# Patient Record
Sex: Male | Born: 1970 | Race: White | Hispanic: No | Marital: Married | Smoking: Never smoker
Health system: Southern US, Community
[De-identification: ages and names within clinical notes are randomized; demographics above are authoritative.]

## PROBLEM LIST (undated history)

## (undated) DIAGNOSIS — T7840XA Allergy, unspecified, initial encounter: Secondary | ICD-10-CM

## (undated) DIAGNOSIS — J45909 Unspecified asthma, uncomplicated: Secondary | ICD-10-CM

## (undated) HISTORY — DX: Allergy, unspecified, initial encounter: T78.40XA

## (undated) HISTORY — DX: Unspecified asthma, uncomplicated: J45.909

## (undated) HISTORY — PX: VASECTOMY: SHX75

---

## 1999-01-12 ENCOUNTER — Emergency Department (HOSPITAL_COMMUNITY): Admission: EM | Admit: 1999-01-12 | Discharge: 1999-01-12 | Payer: Self-pay | Admitting: Emergency Medicine

## 1999-01-13 ENCOUNTER — Encounter: Payer: Self-pay | Admitting: Emergency Medicine

## 1999-01-13 ENCOUNTER — Emergency Department (HOSPITAL_COMMUNITY): Admission: EM | Admit: 1999-01-13 | Discharge: 1999-01-13 | Payer: Self-pay | Admitting: Emergency Medicine

## 1999-01-13 ENCOUNTER — Observation Stay (HOSPITAL_COMMUNITY): Admission: EM | Admit: 1999-01-13 | Discharge: 1999-01-14 | Payer: Self-pay | Admitting: Emergency Medicine

## 2008-10-11 ENCOUNTER — Emergency Department (HOSPITAL_COMMUNITY): Admission: EM | Admit: 2008-10-11 | Discharge: 2008-10-11 | Payer: Self-pay | Admitting: Emergency Medicine

## 2010-05-07 LAB — DIFFERENTIAL
Basophils Absolute: 0 10*3/uL (ref 0.0–0.1)
Basophils Relative: 1 % (ref 0–1)
Eosinophils Absolute: 0.2 10*3/uL (ref 0.0–0.7)
Eosinophils Relative: 2 % (ref 0–5)
Lymphocytes Relative: 19 % (ref 12–46)
Lymphs Abs: 1.3 10*3/uL (ref 0.7–4.0)
Monocytes Absolute: 0.5 10*3/uL (ref 0.1–1.0)
Monocytes Relative: 7 % (ref 3–12)
Neutro Abs: 4.7 10*3/uL (ref 1.7–7.7)
Neutrophils Relative %: 71 % (ref 43–77)

## 2010-05-07 LAB — POCT CARDIAC MARKERS
CKMB, poc: 1.8 ng/mL (ref 1.0–8.0)
CKMB, poc: 2.5 ng/mL (ref 1.0–8.0)
Troponin i, poc: <0.05 ng/mL (ref 0.00–0.09)

## 2010-05-07 LAB — BASIC METABOLIC PANEL
BUN: 7 mg/dL (ref 6–23)
CO2: 26 mEq/L (ref 19–32)
Calcium: 8.9 mg/dL (ref 8.4–10.5)
Chloride: 103 mEq/L (ref 96–112)
Creatinine, Ser: 0.84 mg/dL (ref 0.4–1.5)
GFR calc Af Amer: >60 mL/min (ref >60)
GFR calc non Af Amer: >60 mL/min (ref >60)
Glucose, Bld: 96 mg/dL (ref 70–99)
Potassium: 3.5 mEq/L (ref 3.5–5.1)
Sodium: 139 mEq/L (ref 135–145)

## 2010-05-07 LAB — CBC
HCT: 37.8 % — ABNORMAL LOW (ref 39.0–52.0)
Hemoglobin: 13 g/dL (ref 13.0–17.0)
MCHC: 34.4 g/dL (ref 30.0–36.0)
MCV: 97.8 fL (ref 78.0–100.0)
Platelets: 208 10*3/uL (ref 150–400)
RBC: 3.87 MIL/uL — ABNORMAL LOW (ref 4.22–5.81)
RDW: 12.4 % (ref 11.5–15.5)
WBC: 6.7 10*3/uL (ref 4.0–10.5)

## 2010-05-07 LAB — D-DIMER, QUANTITATIVE: D-Dimer, Quant: <0.22 ug/mL-FEU (ref 0.00–0.48)

## 2011-10-11 ENCOUNTER — Ambulatory Visit (INDEPENDENT_AMBULATORY_CARE_PROVIDER_SITE_OTHER): Payer: 59 | Admitting: Family Medicine

## 2011-10-11 VITALS — BP 103/71 | HR 71 | Temp 98.5°F | Resp 17 | Ht 71.0 in | Wt 169.0 lb

## 2011-10-11 DIAGNOSIS — B029 Zoster without complications: Secondary | ICD-10-CM

## 2011-10-11 MED ORDER — VALACYCLOVIR HCL 1 G PO TABS: 1000.0000 mg | ORAL_TABLET | Freq: Three times a day (TID) | ORAL | Status: DC

## 2011-10-11 NOTE — Progress Notes (Signed)
41 yo man who bumped left forehead 5 days and then a couple days ago started having skin sensitivity and pain with early skin rash on scalp in same area  Objective:  NAD Early vesicular type rash along forehead dermatome  Assessment:  Shingles  Plan:  1. Shingles  valACYclovir (VALTREX) 1000 MG tablet

## 2011-10-11 NOTE — Patient Instructions (Addendum)

## 2012-05-08 ENCOUNTER — Ambulatory Visit (INDEPENDENT_AMBULATORY_CARE_PROVIDER_SITE_OTHER): Payer: 59 | Admitting: Family Medicine

## 2012-05-08 VITALS — BP 118/70 | HR 89 | Temp 97.9°F | Resp 16 | Ht 70.5 in | Wt 172.0 lb

## 2012-05-08 DIAGNOSIS — J309 Allergic rhinitis, unspecified: Secondary | ICD-10-CM

## 2012-05-08 DIAGNOSIS — J45909 Unspecified asthma, uncomplicated: Secondary | ICD-10-CM | POA: Insufficient documentation

## 2012-05-08 DIAGNOSIS — M62830 Muscle spasm of back: Secondary | ICD-10-CM

## 2012-05-08 DIAGNOSIS — M545 Low back pain: Secondary | ICD-10-CM

## 2012-05-08 MED ORDER — ALBUTEROL SULFATE HFA 108 (90 BASE) MCG/ACT IN AERS: 2.0000 | INHALATION_SPRAY | RESPIRATORY_TRACT | Status: DC | PRN

## 2012-05-08 MED ORDER — TRAMADOL HCL 50 MG PO TABS: 50.0000 mg | ORAL_TABLET | Freq: Three times a day (TID) | ORAL | Status: DC | PRN

## 2012-05-08 MED ORDER — IBUPROFEN 800 MG PO TABS: 800.0000 mg | ORAL_TABLET | Freq: Three times a day (TID) | ORAL | Status: DC | PRN

## 2012-05-08 MED ORDER — CETIRIZINE HCL 10 MG PO TABS: 10.0000 mg | ORAL_TABLET | Freq: Every day | ORAL | Status: DC

## 2012-05-08 MED ORDER — ALBUTEROL SULFATE (2.5 MG/3ML) 0.083% IN NEBU: 2.5000 mg | INHALATION_SOLUTION | RESPIRATORY_TRACT | Status: DC | PRN
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
## 2012-07-18 ENCOUNTER — Ambulatory Visit (INDEPENDENT_AMBULATORY_CARE_PROVIDER_SITE_OTHER): Payer: 59 | Admitting: Family Medicine

## 2012-07-18 VITALS — BP 132/80 | HR 92 | Temp 98.4°F | Resp 16 | Ht 71.5 in | Wt 169.2 lb

## 2012-07-18 DIAGNOSIS — L0231 Cutaneous abscess of buttock: Secondary | ICD-10-CM

## 2012-07-18 DIAGNOSIS — L03317 Cellulitis of buttock: Secondary | ICD-10-CM

## 2012-07-18 MED ORDER — DOXYCYCLINE HYCLATE 100 MG PO TABS: 100.0000 mg | ORAL_TABLET | Freq: Two times a day (BID) | ORAL | Status: DC

## 2012-07-18 NOTE — Progress Notes (Signed)
Subjective: 42 year old man who woke up a couple days ago with what he thought were couple of bites on his right buttock. These have become very red and puffy. They're tender. He works doing remodeling. He does not know of a specific type though it would be possible. He is never had MRSA.  Objective: 2 areas on the right buttock. One is significantly more inflamed the other. It is erythematous with a dark central eschar. I pulled the scab off the lateral lesion and cultured it. There is no significant drainage. There is no central fluctuation. Benny Lennert PA also check a she agrees. The main area of induration is almost the size of my hand on the right buttock. It  Assessment: Cellulitis and/or early MRSA abscess right buttock, etiology unclear.  Plan: Culture Doxycycline twice a day Return in 2 days, sooner if concerns.

## 2012-07-18 NOTE — Patient Instructions (Addendum)
Keep clean and cover any draining wounds  Return Friday

## 2012-07-20 ENCOUNTER — Ambulatory Visit (INDEPENDENT_AMBULATORY_CARE_PROVIDER_SITE_OTHER): Payer: 59 | Admitting: Family Medicine

## 2012-07-20 VITALS — BP 116/66 | HR 86 | Temp 97.7°F | Resp 18 | Ht 71.0 in | Wt 168.6 lb

## 2012-07-20 DIAGNOSIS — L0231 Cutaneous abscess of buttock: Secondary | ICD-10-CM

## 2012-07-20 NOTE — Progress Notes (Signed)
Subjective: The places are improving.  Objective: Cultures are negative. Much less swelling and redness. The more medial smaller lesion seems to be coming to a head a little bit. However it does not look nearly as red as it..  Assessment: Abscesses on buttock, etiology unclear. Cellulitis much improved.  Plan: If the place comes onto a head further he is to drain it and express anything out and get. Continue his antibiotics. Return when necessary

## 2012-07-20 NOTE — Patient Instructions (Signed)
Return if needed. Finish antibiotic course.

## 2012-07-23 LAB — WOUND CULTURE
Gram Stain: NONE SEEN
Gram Stain: NONE SEEN

## 2012-12-06 ENCOUNTER — Other Ambulatory Visit: Payer: Self-pay

## 2013-06-17 ENCOUNTER — Other Ambulatory Visit: Payer: Self-pay | Admitting: Family Medicine

## 2013-06-17 ENCOUNTER — Telehealth: Payer: Self-pay

## 2013-06-17 NOTE — Telephone Encounter (Signed)
Would like a refill on his albuterol;[phone]

## 2013-06-19 NOTE — Telephone Encounter (Signed)
RF was sent in 06/17/13 from Rx refill enc. Notified pt done and he needs OV for more.

## 2013-11-22 ENCOUNTER — Ambulatory Visit (INDEPENDENT_AMBULATORY_CARE_PROVIDER_SITE_OTHER): Payer: 59 | Admitting: Family Medicine

## 2013-11-22 VITALS — BP 108/70 | HR 85 | Temp 98.1°F | Resp 16 | Ht 71.25 in | Wt 165.2 lb

## 2013-11-22 DIAGNOSIS — J4530 Mild persistent asthma, uncomplicated: Secondary | ICD-10-CM

## 2013-11-22 DIAGNOSIS — J302 Other seasonal allergic rhinitis: Secondary | ICD-10-CM

## 2013-11-22 MED ORDER — ALBUTEROL SULFATE HFA 108 (90 BASE) MCG/ACT IN AERS: 2.0000 | INHALATION_SPRAY | RESPIRATORY_TRACT | Status: DC | PRN

## 2013-11-22 MED ORDER — BECLOMETHASONE DIPROPIONATE 40 MCG/ACT IN AERS: 1.0000 | INHALATION_SPRAY | Freq: Two times a day (BID) | RESPIRATORY_TRACT | Status: DC

## 2013-11-22 NOTE — Progress Notes (Signed)
Subjective:    Patient ID: Anthony Ramos, male    DOB: 05-27-1970, 43 y.o.   MRN: [id number]  HPI Anthony Ramos is a 43 y.o. male Here for follow up.   Asthma - albuterol inhaler called in in May of this year, last eval in April 2014 by Dr. Clelia CroftShaw.  Mild intermittent based on sx's then - treated with prn albuterol.  Uses infrequently - usually when allergies flair - mold, ragweed typically.  Usually uses otc sudafed or benadryl. Uses albuterol usually less than once per week, rare nighttime wakening - usually timing of when needs albuterol is at night. No hospitalizations recently.  Did need albuterol 1-2 times per day during cold sx's last week.    Patient Active Problem List   Diagnosis Date Noted  . Unspecified asthma 05/08/2012   Past Medical History  Diagnosis Date  . Allergy   . Asthma    Past Surgical History  Procedure Laterality Date  . Vasectomy     Allergies  Allergen Reactions  . Penicillins    Prior to Admission medications   Medication Sig Start Date End Date Taking? Authorizing Provider  albuterol (VENTOLIN HFA) 108 (90 BASE) MCG/ACT inhaler Inhale 2 puffs into the lungs every 4 (four) hours as needed. PATIENT NEEDS OFFICE VISIT FOR ADDITIONAL REFILLS 06/17/13  Yes Eleanore E Egan, PA-C  ibuprofen (ADVIL,MOTRIN) 800 MG tablet Take 1 tablet (800 mg total) by mouth every 8 (eight) hours as needed for pain (and inflammation). 05/08/12  Yes Sherren MochaEva N Shaw, MD  cetirizine (ZYRTEC) 10 MG tablet Take 1 tablet (10 mg total) by mouth daily. 05/08/12   Sherren MochaEva N Shaw, MD  doxycycline (VIBRA-TABS) 100 MG tablet Take 1 tablet (100 mg total) by mouth 2 (two) times daily. 07/18/12   Peyton Najjaravid H Hopper, MD  traMADol (ULTRAM) 50 MG tablet Take 1 tablet (50 mg total) by mouth every 8 (eight) hours as needed for pain. 05/08/12   Sherren MochaEva N Shaw, MD   History   Social History  . Marital Status: Married    Spouse Name: N/A    Number of Children: N/A  . Years of Education: N/A   Occupational History    . Not on file.   Social History Main Topics  . Smoking status: Never Smoker   . Smokeless tobacco: Not on file  . Alcohol Use: Yes  . Drug Use: No  . Sexual Activity: Yes    Birth Control/ Protection: Surgical   Other Topics Concern  . Not on file   Social History Narrative  . No narrative on file       Review of Systems  Constitutional: Negative for fever.  HENT: Positive for congestion and rhinorrhea.   Respiratory: Positive for cough and wheezing. Negative for shortness of breath.   Cardiovascular: Negative for chest pain.       Objective:   Physical Exam  Vitals reviewed. Constitutional: He is oriented to person, place, and time. He appears well-developed and well-nourished. No distress.  HENT:  Head: Normocephalic and atraumatic.  Right Ear: Tympanic membrane, external ear and ear canal normal.  Left Ear: Tympanic membrane, external ear and ear canal normal.  Nose: Mucosal edema present. No rhinorrhea. Right sinus exhibits no maxillary sinus tenderness and no frontal sinus tenderness. Left sinus exhibits no maxillary sinus tenderness and no frontal sinus tenderness.  Mouth/Throat: Oropharynx is clear and moist and mucous membranes are normal. No oropharyngeal exudate or posterior oropharyngeal erythema.  Eyes: Conjunctivae are normal. Pupils  are equal, round, and reactive to light.  Neck: Neck supple.  Cardiovascular: Normal rate, regular rhythm, normal heart sounds and intact distal pulses.   No murmur heard. Pulmonary/Chest: Effort normal and breath sounds normal. He has no wheezes. He has no rhonchi. He has no rales.  Abdominal: Soft. There is no tenderness.  Lymphadenopathy:    He has no cervical adenopathy.  Neurological: He is alert and oriented to person, place, and time.  Skin: Skin is warm and dry. No rash noted.  Psychiatric: He has a normal mood and affect. His behavior is normal.   Filed Vitals:   11/22/13 1216  BP: 108/70  Pulse: 85  Temp:  98.1 F (36.7 C)  TempSrc: Oral  Resp: 16  Height: 5' 11.25" (1.81 m)  Weight: 165 lb 3.2 oz (74.934 kg)  SpO2: 100%      Assessment & Plan:   [name] is a 43 y.o. male Other seasonal allergic rhinitis - Plan: beclomethasone (QVAR) 40 MCG/ACT inhaler, albuterol (VENTOLIN HFA) 108 (90 BASE) MCG/ACT inhaler  -try otc flonase and nonsedating antihistamine if needed. Should improve lower resp tract sx's as well.  Allergen avoidance techniques (already better without carpet at home)  Asthma, mild persistent, uncomplicated - Plan: beclomethasone (QVAR) 40 MCG/ACT inhaler, albuterol (VENTOLIN HFA) 108 (90 BASE) MCG/ACT inhaler  -add Qvar d/t nighttime symptoms. Cont albuterol.  As improves - can try to return to albuterol prn only.   recheck in 6 months.   Meds ordered this encounter  Medications  . beclomethasone (QVAR) 40 MCG/ACT inhaler    Sig: Inhale 1 puff into the lungs 2 (two) times daily.    Dispense:  1 Inhaler    Refill:  5  . albuterol (VENTOLIN HFA) 108 (90 BASE) MCG/ACT inhaler    Sig: Inhale 2 puffs into the lungs every 4 (four) hours as needed.    Dispense:  18 g    Refill:  1   Patient Instructions  flonase nasal spray and can also try allegra, zyrtec or claritin once per day for allergies. Increase asthma treatment to include Qvar - twice per day due to your nighttime asthma symptoms.  Continue albuterol if needed, and when controlled and less allergens, can try coming off Qvar. Return to the clinic or go to the nearest emergency room if any of your symptoms worsen or new symptoms occur.   Asthma Asthma is a recurring condition in which the airways tighten and narrow. Asthma can make it difficult to breathe. It can cause coughing, wheezing, and shortness of breath. Asthma episodes, also called asthma attacks, range from minor to life-threatening. Asthma cannot be cured, but medicines and lifestyle changes can help control it. CAUSES Asthma is believed to be  caused by inherited (genetic) and environmental factors, but its exact cause is unknown. Asthma may be triggered by allergens, lung infections, or irritants in the air. Asthma triggers are different for each person. Common triggers include:   Animal dander.  Dust mites.  Cockroaches.  Pollen from trees or grass.  Mold.  Smoke.  Air pollutants such as dust, household cleaners, hair sprays, aerosol sprays, paint fumes, strong chemicals, or strong odors.  Cold air, weather changes, and winds (which increase molds and pollens in the air).  Strong emotional expressions such as crying or laughing hard.  Stress.  Certain medicines (such as aspirin) or types of drugs (such as beta-blockers).  Sulfites in foods and drinks. Foods and drinks that may contain sulfites include dried fruit,  potato chips, and sparkling grape juice.  Infections or inflammatory conditions such as the flu, a cold, or an inflammation of the nasal membranes (rhinitis).  Gastroesophageal reflux disease (GERD).  Exercise or strenuous activity. SYMPTOMS Symptoms may occur immediately after asthma is triggered or many hours later. Symptoms include:  Wheezing.  Excessive nighttime or early morning coughing.  Frequent or severe coughing with a common cold.  Chest tightness.  Shortness of breath. DIAGNOSIS  The diagnosis of asthma is made by a review of your medical history and a physical exam. Tests may also be performed. These may include:  Lung function studies. These tests show how much air you breathe in and out.  Allergy tests.  Imaging tests such as X-rays. TREATMENT  Asthma cannot be cured, but it can usually be controlled. Treatment involves identifying and avoiding your asthma triggers. It also involves medicines. There are 2 classes of medicine used for asthma treatment:   Controller medicines. These prevent asthma symptoms from occurring. They are usually taken every day.  Reliever or rescue  medicines. These quickly relieve asthma symptoms. They are used as needed and provide short-term relief. Your health care provider will help you create an asthma action plan. An asthma action plan is a written plan for managing and treating your asthma attacks. It includes a list of your asthma triggers and how they may be avoided. It also includes information on when medicines should be taken and when their dosage should be changed. An action plan may also involve the use of a device called a peak flow meter. A peak flow meter measures how well the lungs are working. It helps you monitor your condition. HOME CARE INSTRUCTIONS   Take medicines only as directed by your health care provider. Speak with your health care provider if you have questions about how or when to take the medicines.  Use a peak flow meter as directed by your health care provider. Record and keep track of readings.  Understand and use the action plan to help minimize or stop an asthma attack without needing to seek medical care.  Control your home environment in the following ways to help prevent asthma attacks:  Do not smoke. Avoid being exposed to secondhand smoke.  Change your heating and air conditioning filter regularly.  Limit your use of fireplaces and wood stoves.  Get rid of pests (such as roaches and mice) and their droppings.  Throw away plants if you see mold on them.  Clean your floors and dust regularly. Use unscented cleaning products.  Try to have someone else vacuum for you regularly. Stay out of rooms while they are being vacuumed and for a short while afterward. If you vacuum, use a dust mask from a hardware store, a double-layered or microfilter vacuum cleaner bag, or a vacuum cleaner with a HEPA filter.  Replace carpet with wood, tile, or vinyl flooring. Carpet can trap dander and dust.  Use allergy-proof pillows, mattress covers, and box spring covers.  Wash bed sheets and blankets every week  in hot water and dry them in a dryer.  Use blankets that are made of polyester or cotton.  Clean bathrooms and kitchens with bleach. If possible, have someone repaint the walls in these rooms with mold-resistant paint. Keep out of the rooms that are being cleaned and painted.  Wash hands frequently. SEEK MEDICAL CARE IF:   You have wheezing, shortness of breath, or a cough even if taking medicine to prevent attacks.  The colored  mucus you cough up (sputum) is thicker than usual.  Your sputum changes from clear or white to yellow, green, gray, or bloody.  You have any problems that may be related to the medicines you are taking (such as a rash, itching, swelling, or trouble breathing).  You are using a reliever medicine more than 2-3 times per week.  Your peak flow is still at 50-79% of your personal best after following your action plan for 1 hour.  You have a fever. SEEK IMMEDIATE MEDICAL CARE IF:   You seem to be getting worse and are unresponsive to treatment during an asthma attack.  You are short of breath even at rest.  You get short of breath when doing very little physical activity.  You have difficulty eating, drinking, or talking due to asthma symptoms.  You develop chest pain.  You develop a fast heartbeat.  You have a bluish color to your lips or fingernails.  You are light-headed, dizzy, or faint.  Your peak flow is less than 50% of your personal best. MAKE SURE YOU:   Understand these instructions.  Will watch your condition.  Will get help right away if you are not doing well or get worse. Document Released: 01/17/2005 Document Revised: 06/03/2013 Document Reviewed: 08/16/2012 [facility] Patient Information 2015 [employer], [place]. This information is not intended to replace advice given to you by your health care provider. Make sure you discuss any questions you have with your health care provider.

## 2013-11-22 NOTE — Patient Instructions (Signed)
flonase nasal spray and can also try allegra, zyrtec or claritin once per day for allergies. Increase asthma treatment to include Qvar - twice per day due to your nighttime asthma symptoms.  Continue albuterol if needed, and when controlled and less allergens, can try coming off Qvar. Return to the clinic or go to the nearest emergency room if any of your symptoms worsen or new symptoms occur.   Asthma Asthma is a recurring condition in which the airways tighten and narrow. Asthma can make it difficult to breathe. It can cause coughing, wheezing, and shortness of breath. Asthma episodes, also called asthma attacks, range from minor to life-threatening. Asthma cannot be cured, but medicines and lifestyle changes can help control it. CAUSES Asthma is believed to be caused by inherited (genetic) and environmental factors, but its exact cause is unknown. Asthma may be triggered by allergens, lung infections, or irritants in the air. Asthma triggers are different for each person. Common triggers include:   Animal dander.  Dust mites.  Cockroaches.  Pollen from trees or grass.  Mold.  Smoke.  Air pollutants such as dust, household cleaners, hair sprays, aerosol sprays, paint fumes, strong chemicals, or strong odors.  Cold air, weather changes, and winds (which increase molds and pollens in the air).  Strong emotional expressions such as crying or laughing hard.  Stress.  Certain medicines (such as aspirin) or types of drugs (such as beta-blockers).  Sulfites in foods and drinks. Foods and drinks that may contain sulfites include dried fruit, potato chips, and sparkling grape juice.  Infections or inflammatory conditions such as the flu, a cold, or an inflammation of the nasal membranes (rhinitis).  Gastroesophageal reflux disease (GERD).  Exercise or strenuous activity. SYMPTOMS Symptoms may occur immediately after asthma is triggered or many hours later. Symptoms  include:  Wheezing.  Excessive nighttime or early morning coughing.  Frequent or severe coughing with a common cold.  Chest tightness.  Shortness of breath. DIAGNOSIS  The diagnosis of asthma is made by a review of your medical history and a physical exam. Tests may also be performed. These may include:  Lung function studies. These tests show how much air you breathe in and out.  Allergy tests.  Imaging tests such as X-rays. TREATMENT  Asthma cannot be cured, but it can usually be controlled. Treatment involves identifying and avoiding your asthma triggers. It also involves medicines. There are 2 classes of medicine used for asthma treatment:   Controller medicines. These prevent asthma symptoms from occurring. They are usually taken every day.  Reliever or rescue medicines. These quickly relieve asthma symptoms. They are used as needed and provide short-term relief. Your health care provider will help you create an asthma action plan. An asthma action plan is a written plan for managing and treating your asthma attacks. It includes a list of your asthma triggers and how they may be avoided. It also includes information on when medicines should be taken and when their dosage should be changed. An action plan may also involve the use of a device called a peak flow meter. A peak flow meter measures how well the lungs are working. It helps you monitor your condition. HOME CARE INSTRUCTIONS   Take medicines only as directed by your health care provider. Speak with your health care provider if you have questions about how or when to take the medicines.  Use a peak flow meter as directed by your health care provider. Record and keep track of readings.  Understand and use the action plan to help minimize or stop an asthma attack without needing to seek medical care.  Control your home environment in the following ways to help prevent asthma attacks:  Do not smoke. Avoid being exposed to  secondhand smoke.  Change your heating and air conditioning filter regularly.  Limit your use of fireplaces and wood stoves.  Get rid of pests (such as roaches and mice) and their droppings.  Throw away plants if you see mold on them.  Clean your floors and dust regularly. Use unscented cleaning products.  Try to have someone else vacuum for you regularly. Stay out of rooms while they are being vacuumed and for a short while afterward. If you vacuum, use a dust mask from a hardware store, a double-layered or microfilter vacuum cleaner bag, or a vacuum cleaner with a HEPA filter.  Replace carpet with wood, tile, or vinyl flooring. Carpet can trap dander and dust.  Use allergy-proof pillows, mattress covers, and box spring covers.  Wash bed sheets and blankets every week in hot water and dry them in a dryer.  Use blankets that are made of polyester or cotton.  Clean bathrooms and kitchens with bleach. If possible, have someone repaint the walls in these rooms with mold-resistant paint. Keep out of the rooms that are being cleaned and painted.  Wash hands frequently. SEEK MEDICAL CARE IF:   You have wheezing, shortness of breath, or a cough even if taking medicine to prevent attacks.  The colored mucus you cough up (sputum) is thicker than usual.  Your sputum changes from clear or white to yellow, green, gray, or bloody.  You have any problems that may be related to the medicines you are taking (such as a rash, itching, swelling, or trouble breathing).  You are using a reliever medicine more than 2-3 times per week.  Your peak flow is still at 50-79% of your personal best after following your action plan for 1 hour.  You have a fever. SEEK IMMEDIATE MEDICAL CARE IF:   You seem to be getting worse and are unresponsive to treatment during an asthma attack.  You are short of breath even at rest.  You get short of breath when doing very little physical activity.  You have  difficulty eating, drinking, or talking due to asthma symptoms.  You develop chest pain.  You develop a fast heartbeat.  You have a bluish color to your lips or fingernails.  You are light-headed, dizzy, or faint.  Your peak flow is less than 50% of your personal best. MAKE SURE YOU:   Understand these instructions.  Will watch your condition.  Will get help right away if you are not doing well or get worse. Document Released: 01/17/2005 Document Revised: 06/03/2013 Document Reviewed: 08/16/2012 [facility] Patient Information 2015 [place], [place]. This information is not intended to replace advice given to you by your health care provider. Make sure you discuss any questions you have with your health care provider.

## 2014-07-14 ENCOUNTER — Other Ambulatory Visit: Payer: Self-pay | Admitting: Family Medicine

## 2014-11-11 ENCOUNTER — Ambulatory Visit (INDEPENDENT_AMBULATORY_CARE_PROVIDER_SITE_OTHER): Payer: 59 | Admitting: Family Medicine

## 2014-11-11 VITALS — BP 118/78 | HR 77 | Temp 98.0°F | Resp 16 | Ht 71.25 in | Wt 172.0 lb

## 2014-11-11 DIAGNOSIS — J4530 Mild persistent asthma, uncomplicated: Secondary | ICD-10-CM

## 2014-11-11 DIAGNOSIS — G479 Sleep disorder, unspecified: Secondary | ICD-10-CM

## 2014-11-11 DIAGNOSIS — F410 Panic disorder [episodic paroxysmal anxiety] without agoraphobia: Secondary | ICD-10-CM

## 2014-11-11 DIAGNOSIS — J302 Other seasonal allergic rhinitis: Secondary | ICD-10-CM

## 2014-11-11 DIAGNOSIS — R21 Rash and other nonspecific skin eruption: Secondary | ICD-10-CM | POA: Diagnosis not present

## 2014-11-11 DIAGNOSIS — L309 Dermatitis, unspecified: Secondary | ICD-10-CM

## 2014-11-11 LAB — POCT SKIN KOH: Skin KOH, POC: NEGATIVE

## 2014-11-11 MED ORDER — ALBUTEROL SULFATE HFA 108 (90 BASE) MCG/ACT IN AERS: 2.0000 | INHALATION_SPRAY | RESPIRATORY_TRACT | Status: DC | PRN

## 2014-11-11 MED ORDER — MONTELUKAST SODIUM 10 MG PO TABS: 10.0000 mg | ORAL_TABLET | Freq: Every day | ORAL | Status: DC

## 2014-11-11 MED ORDER — BECLOMETHASONE DIPROPIONATE 40 MCG/ACT IN AERS: 1.0000 | INHALATION_SPRAY | Freq: Two times a day (BID) | RESPIRATORY_TRACT | Status: DC

## 2014-11-11 MED ORDER — TRIAMCINOLONE ACETONIDE 0.1 % EX CREA: 1.0000 "application " | TOPICAL_CREAM | Freq: Two times a day (BID) | CUTANEOUS | Status: DC

## 2014-11-11 NOTE — Patient Instructions (Signed)
Use the triamcinolone cream twice daily on the rash, rubbing and a small amount. Try to avoid vinyl gloves.  Take Singulair 1 each evening  Continue using the Qvar, but increase to twice a day  Continue using the albuterol on an as-needed basis. You might try using this 2 puffs at bedtime also.  If symptoms do not improve please return. Might need a long-acting bronchodilator or going ahead and getting a sleep study.

## 2014-11-11 NOTE — Progress Notes (Signed)
Patient ID: Anthony Ramos, male    DOB: 02-May-1970  Age: 44 y.o. MRN: [id number]  Chief Complaint  Patient presents with  . Medication Refill    albuterol  . Depression    Per screening  . 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 He has been waking up at night with panic attacks with asthma feeling like he can't breathe. He has been stressed with the turmoil of the world, but does not have daytime panic attacks. 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 He has not been having excessive depression. He drinks a couple of beers most nights, does not use substances, does not smoke. He is married but his wife works nights. However she does not complain of his snoring very often. He has some nasal stuffiness. He uses the inhaler (albuterol) when needed, but one canister has lasted him last year. He does use Qvar 40 one inhalation daily. He exercises, doing some running, and does not have excessive shortness of breath or wheezing then.  Incidentally he also has a dry flaky rash on his hands and forearms that itches. It comes and goes but it still persists.  Current allergies, medications, problem list, past/family and social histories reviewed.  Objective:  BP 118/78 mmHg  Pulse 77  Temp(Src) 98 F (36.7 C) (Oral)  Resp 16  Ht 5' 11.25" (1.81 m)  Wt 172 lb (78.019 kg)  BMI 23.81 kg/m2  SpO2 98%  Eczematoid or psoriatic appearing rash on the hands with some spots on the arms that look more like small psoriatic spots. His mom did have some eczema. He sniffles occasionally. His neck supple without nodes. Chest clear. Heart regular without murmurs. Neck was 15 inches in diameter  Assessment & Plan:   Assessment: 1. Other seasonal allergic rhinitis   2. Asthma, mild persistent, uncomplicated   3. Sleep disturbances   4. Panic attack   5.  Rash of hands   6. Eczema       Plan: Will increase the Qvar twice daily, continue using the albuterol on an as-needed basis, and try Singulair one dose daily. If not doing better may need to do a sleep study on him. Skin scraping of the dermatitis  Skin scraping looks normal to me, although tech thought they had seen a couple of hyphae I cannot find it.       Patient Instructions  Use the triamcinolone cream twice daily on the rash, rubbing and a small amount. Try to avoid vinyl gloves.  Take Singulair 1 each evening  Continue using the Qvar, but increase to twice a day  Continue using the albuterol on an as-needed basis. You might try using this 2 puffs at bedtime also.  If symptoms do not improve please return. Might need a long-acting bronchodilator or going ahead and getting a sleep study.     Return if symptoms worsen or fail to improve.   HOPPER,DAVID, MD 11/11/2014 6

## 2014-12-03 ENCOUNTER — Ambulatory Visit (INDEPENDENT_AMBULATORY_CARE_PROVIDER_SITE_OTHER): Payer: 59 | Admitting: Family Medicine

## 2014-12-03 VITALS — BP 120/78 | HR 79 | Temp 97.6°F | Resp 20 | Ht 76.0 in | Wt 171.2 lb

## 2014-12-03 DIAGNOSIS — M62830 Muscle spasm of back: Secondary | ICD-10-CM | POA: Diagnosis not present

## 2014-12-03 DIAGNOSIS — M545 Low back pain, unspecified: Secondary | ICD-10-CM

## 2014-12-03 MED ORDER — TRAMADOL HCL 50 MG PO TABS: 50.0000 mg | ORAL_TABLET | Freq: Three times a day (TID) | ORAL | Status: DC | PRN

## 2014-12-03 MED ORDER — CYCLOBENZAPRINE HCL 10 MG PO TABS: 10.0000 mg | ORAL_TABLET | Freq: Three times a day (TID) | ORAL | Status: DC | PRN

## 2014-12-03 MED ORDER — PREDNISONE 20 MG PO TABS: ORAL_TABLET | ORAL | Status: DC

## 2014-12-03 NOTE — Progress Notes (Signed)
Subjective:    Patient ID: Anthony Ramos, male    DOB: [birth year], 44 y.o.   MRN: [id number] This chart was scribed for Anthony Sorenson, MD by Littie Deeds, Medical Scribe. This patient was seen in Room 1 and the patient's care was started at 4:16 PM.    Chief Complaint  Patient presents with  . Back Pain    low back pain last week, took ibuprofen     HPI HPI Comments: Anthony Ramos is a 44 y.o. male who presents to the [facility] complaining of gradual onset, bilateral low back pain that started last week when he injured his back at work. Patient notes that he has had some swelling in his back over the last few days. The pain did radiate to his legs initially, but not currently. He notes the pain is beginning to radiating around his sides. The pain is worse when standing up from a seated position, but notes that the pain eases some with ambulation. He has tried ibuprofen but without relief. Patient denies arthralgias at baseline or arthritis symptoms. He also denies history of kidney stones. He notes that he had a similar injury about 5 years ago.  Patient works in [employer] and his work involves heavy lifting.   Past Medical History  Diagnosis Date  . Allergy   . Asthma    Past Surgical History  Procedure Laterality Date  . Vasectomy     Current Outpatient Prescriptions on File Prior to Visit  Medication Sig Dispense Refill  . albuterol (VENTOLIN HFA) 108 (90 BASE) MCG/ACT inhaler Inhale 2 puffs into the lungs every 4 (four) hours as needed for wheezing or shortness of breath. 18 g 5  . beclomethasone (QVAR) 40 MCG/ACT inhaler Inhale 1 puff into the lungs 2 (two) times daily. 1 Inhaler 5  . cetirizine (ZYRTEC) 10 MG tablet Take 1 tablet (10 mg total) by mouth daily. 30 tablet 5  . montelukast (SINGULAIR) 10 MG tablet Take 1 tablet (10 mg total) by mouth at bedtime. 30 tablet 5  . triamcinolone cream (KENALOG) 0.1 % Apply 1 application topically 2 (two) times daily.  30 g 0   No current facility-administered medications on file prior to visit.   Allergies  Allergen Reactions  . Penicillins    Family History  Problem Relation Age of Onset  . Heart disease Father    Social History   Social History  . Marital Status: Married    Spouse Name: N/A  . Number of Children: N/A  . Years of Education: N/A   Social History Main Topics  . Smoking status: Never Smoker   . Smokeless tobacco: None  . Alcohol Use: Yes  . Drug Use: No  . Sexual Activity: Yes    Birth Control/ Protection: Surgical   Other Topics Concern  . None   Social History Narrative    Review of Systems  Constitutional: Positive for activity change. Negative for fever and chills.  Cardiovascular: Positive for leg swelling.  Gastrointestinal: Negative for abdominal pain, diarrhea and constipation.  Genitourinary: Positive for flank pain. Negative for urgency, frequency, hematuria, decreased urine volume and difficulty urinating.  Musculoskeletal: Positive for myalgias, back pain, joint swelling and arthralgias. Negative for gait problem.  Skin: Negative for color change and wound.  Neurological: Negative for dizziness, weakness, light-headedness and numbness.       Objective:  BP 120/78 mmHg  Pulse 79  Temp(Src) 97.6 F (36.4 C) (Oral)  Resp 20  Ht  6\' 4"  (1.93 m)  Wt 171 lb 3.2 oz (77.656 kg)  BMI 20.85 kg/m2  SpO2 99%  Physical Exam  Constitutional: He is oriented to person, place, and time. He appears well-developed and well-nourished. No distress.  HENT:  Head: Normocephalic and atraumatic.  Mouth/Throat: Oropharynx is clear and moist. No oropharyngeal exudate.  Eyes: Pupils are equal, round, and reactive to light.  Neck: Neck supple.  Cardiovascular: Normal rate.   Pulmonary/Chest: Effort normal.  Abdominal: Soft. Bowel sounds are normal. He exhibits no distension. There is no hepatosplenomegaly. There is no tenderness.  Musculoskeletal: He exhibits no edema.   Negative straight leg raise bilaterally. Pain to upper gluteal, posterior iliac crest region bilaterally.  Neurological: He is alert and oriented to person, place, and time. No cranial nerve deficit.  4/5 strength in hip flexors. 4+/5 in hamstrings. 5/5 in quads.  Skin: Skin is warm and dry. No rash noted.  Psychiatric: He has a normal mood and affect. His behavior is normal.  Nursing note and vitals reviewed.         Assessment & Plan:   1. Spasm of back muscles   2. Acute bilateral low back pain without sciatica     Meds ordered this encounter  Medications  . predniSONE (DELTASONE) 20 MG tablet    Sig: 4 tabs x 2d, 3tabs x 2d, 2 tabs x 2d, 1 tab x 2d, 1/2 tab x 2d    Dispense:  21 tablet    Refill:  0  . cyclobenzaprine (FLEXERIL) 10 MG tablet    Sig: Take 1 tablet (10 mg total) by mouth 3 (three) times daily as needed for muscle spasms.    Dispense:  30 tablet    Refill:  0  . traMADol (ULTRAM) 50 MG tablet    Sig: Take 1 tablet (50 mg total) by mouth every 8 (eight) hours as needed.    Dispense:  30 tablet    Refill:  1    I personally performed the services described in this documentation, which was scribed in my presence. The recorded information has been reviewed and considered, and addended by me as needed.  Anthony SorensonEva Shaw, MD MPH   By signing my name below, I, Littie Deedsichard Sun, attest that this documentation has been prepared under the direction and in the presence of Anthony SorensonEva Shaw, MD.  Electronically Signed: Littie Deedsichard Sun, Medical Scribe. 12/03/2014. 4:16 PM.

## 2014-12-03 NOTE — Patient Instructions (Signed)
Back Injury Prevention Back injuries can be very painful. They can also be difficult to heal. After having one back injury, you are more likely to injure your back again. It is important to learn how to avoid injuring or re-injuring your back. The following tips can help you to prevent a back injury. WHAT SHOULD I KNOW ABOUT PHYSICAL FITNESS?  Exercise for 30 minutes per day on most days of the week or as directed by your health care provider. Make sure to:  Do aerobic exercises, such as walking, jogging, biking, or swimming.  Do exercises that increase balance and strength, such as tai chi and yoga. These can decrease your risk of falling and injuring your back.  Do stretching exercises to help with flexibility.  Try to develop strong abdominal muscles. Your abdominal muscles provide a lot of the support that is needed by your back.  Maintain a healthy weight. This helps to decrease your risk of a back injury. WHAT SHOULD I KNOW ABOUT MY DIET?  Talk with your health care provider about your overall diet. Take supplements and vitamins only as directed by your health care provider.  Talk with your health care provider about how much calcium and vitamin D you need each day. These nutrients help to prevent weakening of the bones (osteoporosis). Osteoporosis can cause broken (fractured) bones, which lead to back pain.  Include good sources of calcium in your diet, such as dairy products, green leafy vegetables, and products that have had calcium added to them (fortified).  Include good sources of vitamin D in your diet, such as milk and foods that are fortified with vitamin D. WHAT SHOULD I KNOW ABOUT MY POSTURE?  Sit up straight and stand up straight. Avoid leaning forward when you sit or hunching over when you stand.  Choose chairs that have good low-back (lumbar) support.  If you work at a desk, sit close to it so you do not need to lean over. Keep your chin tucked in. Keep your neck  drawn back, and keep your elbows bent at a right angle. Your arms should look like the letter "L."  Sit high and close to the steering wheel when you drive. Add a lumbar support to your car seat, if needed.  Avoid sitting or standing in one position for very long. Take breaks to get up, stretch, and walk around at least one time every hour. Take breaks every hour if you are driving for long periods of time.  Sleep on your side with your knees slightly bent, or sleep on your back with a pillow under your knees. Do not lie on the front of your body to sleep. WHAT SHOULD I KNOW ABOUT LIFTING, TWISTING, AND REACHING? Lifting and Heavy Lifting  Avoid heavy lifting, especially repetitive heavy lifting. If you must do heavy lifting:  Stretch before lifting.  Work slowly.  Rest between lifts.  Use a tool such as a cart or a dolly to move objects if one is available.  Make several small trips instead of carrying one heavy load.  Ask for help when you need it, especially when moving big objects.  Follow these steps when lifting:  Stand with your feet shoulder-width apart.  Get as close to the object as you can. Do not try to pick up a heavy object that is far from your body.  Use handles or lifting straps if they are available.  Bend at your knees. Squat down, but keep your heels off the floor.    Keep your shoulders pulled back, your chin tucked in, and your back straight.  Lift the object slowly while you tighten the muscles in your legs, abdomen, and buttocks. Keep the object as close to the center of your body as possible.  Follow these steps when putting down a heavy load:  Stand with your feet shoulder-width apart.  Lower the object slowly while you tighten the muscles in your legs, abdomen, and buttocks. Keep the object as close to the center of your body as possible.  Keep your shoulders pulled back, your chin tucked in, and your back straight.  Bend at your knees. Squat  down, but keep your heels off the floor.  Use handles or lifting straps if they are available. Twisting and Reaching  Avoid lifting heavy objects above your waist.  Do not twist at your waist while you are lifting or carrying a load. If you need to turn, move your feet.  Do not bend over without bending at your knees.  Avoid reaching over your head, across a table, or for an object on a high surface. WHAT ARE SOME OTHER TIPS?  Avoid wet floors and icy ground. Keep sidewalks clear of ice to prevent falls.  Do not sleep on a mattress that is too soft or too hard.  Keep items that are used frequently within easy reach.  Put heavier objects on shelves at waist level, and put lighter objects on lower or higher shelves.  Find ways to decrease your stress, such as exercise, massage, or relaxation techniques. Stress can build up in your muscles. Tense muscles are more vulnerable to injury.  Talk with your health care provider if you feel anxious or depressed. These conditions can make back pain worse.  Wear flat heel shoes with cushioned soles.  Avoid sudden movements.  Use both shoulder straps when carrying a backpack.  Do not use any tobacco products, including cigarettes, chewing tobacco, or electronic cigarettes. If you need help quitting, ask your health care provider.   This information is not intended to replace advice given to you by your health care provider. Make sure you discuss any questions you have with your health care provider.   Document Released: 02/25/2004 Document Revised: 06/03/2014 Document Reviewed: 01/21/2014 [employer] Interactive Patient Education 2016 [employer]  

## 2015-02-26 MED FILL — VENTOLIN HFA 90 MCG INHALER: 108 (90 BAS | 30 days supply | Qty: 18 | Fill #1

## 2015-02-26 MED FILL — QVAR 40 MCG ORAL INHALER: 40 | 30 days supply | Qty: 9 | Fill #1

## 2015-04-30 MED FILL — QVAR 40 MCG ORAL INHALER: 40 | 30 days supply | Qty: 9 | Fill #2

## 2015-04-30 MED FILL — VENTOLIN HFA 90 MCG INHALER: 108 (90 BAS | 30 days supply | Qty: 18 | Fill #2

## 2015-05-11 ENCOUNTER — Ambulatory Visit (INDEPENDENT_AMBULATORY_CARE_PROVIDER_SITE_OTHER): Payer: 59 | Admitting: Family Medicine

## 2015-05-11 VITALS — BP 114/76 | HR 94 | Temp 98.2°F | Resp 15 | Ht 76.0 in | Wt 157.8 lb

## 2015-05-11 DIAGNOSIS — F418 Other specified anxiety disorders: Secondary | ICD-10-CM

## 2015-05-11 DIAGNOSIS — F411 Generalized anxiety disorder: Secondary | ICD-10-CM

## 2015-05-11 DIAGNOSIS — M5431 Sciatica, right side: Secondary | ICD-10-CM | POA: Diagnosis not present

## 2015-05-11 DIAGNOSIS — F410 Panic disorder [episodic paroxysmal anxiety] without agoraphobia: Secondary | ICD-10-CM

## 2015-05-11 MED ORDER — SERTRALINE HCL 50 MG PO TABS: 50.0000 mg | ORAL_TABLET | Freq: Every day | ORAL | Status: DC

## 2015-05-11 MED ORDER — ALPRAZOLAM 0.25 MG PO TABS: 0.2500 mg | ORAL_TABLET | Freq: Two times a day (BID) | ORAL | Status: DC | PRN

## 2015-05-11 MED ORDER — CYCLOBENZAPRINE HCL 5 MG PO TABS: ORAL_TABLET | ORAL | Status: DC

## 2015-05-11 MED ORDER — MELOXICAM 7.5 MG PO TABS: 7.5000 mg | ORAL_TABLET | Freq: Every day | ORAL | Status: DC

## 2015-05-11 MED FILL — SERTRALINE HCL 50 MG TABLET: 50 | 30 days supply | Qty: 30 | Fill #0

## 2015-05-11 MED FILL — CYCLOBENZAPRINE 5 MG TABLET: 5 | 5 days supply | Qty: 15 | Fill #0

## 2015-05-11 MED FILL — MELOXICAM 7.5 MG TABLET: 7.5 | 30 days supply | Qty: 30 | Fill #0

## 2015-05-11 NOTE — Progress Notes (Signed)
Subjective:  By signing my name below, I, Anthony Ramos, attest that this documentation has been prepared under the direction and in the presence of Anthony Agreste, MD Electronically Signed: Ladene Artist, ED Scribe 05/11/2015 at 3:15 PM.   Patient ID: Anthony Ramos, male    DOB: 01/06/1971, 45 y.o.   MRN: [id number]  Chief Complaint  Patient presents with  . Back Pain    lower x 2 weeks denies any injury or fall  . Leg Pain    x 2 weeks some numbess  . Hip Pain    x 2 weeks   . Anxiety    panic attack nightly since December , per pt lots of stress in his life Depression Screening done    HPI  HPI Comments: Anthony Ramos is a 45 y.o. male who presents to the [facility] complaining of persistent low back pain for the past 2 weeks.  Low back pain, Leg Pain, Hip Pain Pt presents with persistent right low back pain that radiates into the right hip, right buttock and right posterior calf for the past 2 weeks. Pt denies recent fall or injury. He states that pain is worsened with sitting and driving. Pt has tried a heating pad and ibuprofen twice daily with minimal relief. He denies weakness, bladder/bowel incontinence, saddle anesthesia. Pt has a h/o sciatic which resolved with Prednisone without side effects.   Anxiety Pt also presents with gradually worsened anxiety for the past few months. Pt initially experienced an anxiety attack several years ago in the middle of the night. He states that he was awakened by severe chest palpitations, used his albuterol inhaler and was brought into the the ED by EMS for suspected asthma attack or MI, but was diagnosed with an anxiety attack. Normal EKG and cardiac workup. He was prescribed 0.25 mg Xanax every 8 hours that he did not take. Pt states that symptoms resolved until approximately 4 months ago when he started having increased anxiety again. Pt states that he was having anxiety attacks nightly which he has treated with  Benadryl and nightly walks around his home. Other coping mechanisms include speaking with spiritual advisors, meditating and practicing Yoga. 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 Pt drinks 2-3 beers/week. He denies SI/HI, chest pain or chest palpitations on exertion. He has been prescribed Zoloft approximately 10 years ago for 2-3 months for anxiety with relief.   Depression screen [facility] 2/9 05/11/2015 05/11/2015 12/03/2014 11/11/2014  Decreased Interest - 0 0 0  Down, Depressed, Hopeless 3 3 0 3  PHQ - 2 Score 3 3 0 3  Altered sleeping 3 3 - 3  Tired, decreased energy 3 - - 0  Change in appetite 2 - - 0  Feeling bad or failure about yourself  0 - - 0  Trouble concentrating 3 - - 0  Moving slowly or fidgety/restless 3 - - 0  Suicidal thoughts 0 - - 0  PHQ-9 Score 17 6 - 6  Difficult doing work/chores Not difficult at all - - Somewhat difficult    Patient Active Problem List   Diagnosis Date Noted  . Unspecified asthma(493.90) 05/08/2012   Past Medical History  Diagnosis Date  . Allergy   . Asthma    Past Surgical History  Procedure Laterality Date  . Vasectomy     Allergies  Allergen Reactions  . Penicillins    Prior to  Admission medications   Medication Sig Start Date End Date Taking? Authorizing Provider  albuterol (VENTOLIN HFA) 108 (90 BASE) MCG/ACT inhaler Inhale 2 puffs into the lungs every 4 (four) hours as needed for wheezing or shortness of breath. 11/11/14  Yes Posey Boyer, MD  beclomethasone (QVAR) 40 MCG/ACT inhaler Inhale 1 puff into the lungs 2 (two) times daily. 11/11/14  Yes Posey Boyer, MD  triamcinolone cream (KENALOG) 0.1 % Apply 1 application topically 2 (two) times daily. 11/11/14  Yes Posey Boyer, MD  traMADol (ULTRAM) 50 MG tablet Take 1 tablet (50 mg total) by mouth every 8 (eight) hours as needed. Patient not taking: Reported on 05/11/2015 12/03/14   Anthony Knapp,  MD   Social History   Social History  . Marital Status: Married    Spouse Name: N/A  . Number of Children: N/A  . Years of Education: N/A   Occupational History  . Not on file.   Social History Main Topics  . Smoking status: Never Smoker   . Smokeless tobacco: Not on file  . Alcohol Use: Yes  . Drug Use: No  . Sexual Activity: Yes    Birth Control/ Protection: Surgical   Other Topics Concern  . Not on file   Social History Narrative   Review of Systems  Cardiovascular: Negative for chest pain.  Musculoskeletal: Positive for back pain and arthralgias.  Neurological: Negative for weakness.  Psychiatric/Behavioral: Negative for suicidal ideas. The patient is nervous/anxious.       Objective:   Physical Exam  Constitutional: He is oriented to person, place, and time. He appears well-developed and well-nourished. No distress.  HENT:  Head: Normocephalic and atraumatic.  Eyes: Conjunctivae and EOM are normal.  Neck: Neck supple. No tracheal deviation present.  Cardiovascular: Normal rate.   Pulmonary/Chest: Effort normal. No respiratory distress.  Musculoskeletal: Normal range of motion.  Tender along the R sciatic notch. Lumbar spine is nontender. Pain is reproducible with fwd flexion of the back. Minimal discomfort with seated straight leg raise on the R.   Neurological: He is alert and oriented to person, place, and time.  Reflex Scores:      Patellar reflexes are 2+ on the right side and 2+ on the left side. Skin: Skin is warm and dry.  Psychiatric: He has a normal mood and affect. His behavior is normal.  Nursing note and vitals reviewed.     Assessment & Plan:   [name] is a 45 y.o. 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 Frequent panic attacks versus anxiety attacks.  -Start Zoloft 50 mg daily. Discussed may need higher dose for anxiety symptoms, but can start at 50 mg initially. -   -Stress and stress management handout given.Continue yoga, meditation, other relaxation/coping techniques.  -Phone numbers for counseling to help with CBT, stress management.  -Short course of Xanax given if breakthrough symptoms. Recheck 3-4 weeks.  Right sided sciatica  - No red flags on exam or history. Imaging deferred at present.   -Start meloxicam 7.5 mg daily, Flexeril 5 mg daily at bedtime when necessary  -home exercise program and handout given, but if not improving into next week, could start short course of prednisone. He has tolerated this  in the past without psychiatric side effects. RTC precautions   Meds ordered this encounter  Medications  . sertraline (ZOLOFT) 50 MG tablet    Sig: Take 1 tablet (50 mg total) by mouth daily.    Dispense:  30 tablet    Refill:  3  . ALPRAZolam (XANAX) 0.25 MG tablet    Sig: Take 1 tablet (0.25 mg total) by mouth 2 (two) times daily as needed for anxiety.    Dispense:  20 tablet    Refill:  0  . meloxicam (MOBIC) 7.5 MG tablet    Sig: Take 1 tablet (7.5 mg total) by mouth daily.    Dispense:  30 tablet    Refill:  0  . cyclobenzaprine (FLEXERIL) 5 MG tablet    Sig: 1 pill by mouth up to every 8 hours as needed. Start with one pill by mouth each bedtime as needed due to sedation    Dispense:  15 tablet    Refill:  0   Patient Instructions       IF you received an x-ray today, you will receive an invoice from [facility]. Please contact [facility] at [phone] with questions or concerns regarding your invoice.   IF you received labwork today, you will receive an invoice from [employer]. Please contact [facility] at [phone] with questions or  concerns regarding your invoice.   Our billing staff will not be able to assist you with questions regarding bills from these companies.  You will be contacted with the lab results as soon as they are available. The fastest way to get your results is to activate your My Chart account. Instructions are located on the last page of this paperwork. If you have not heard from [place] regarding the results in 2 weeks, please contact this office.     Start Zoloft once per day, see information below on stress and stress management, as well as phone numbers for counseling. Follow-up with me in the next 3-4 weeks. If you do have panic or anxiety attacks again, I did write for short course of Xanax, but plan for this as needed only.    Vivia Budge: [phone] Arvil Chaco: [phone]   Symptoms to some sciatic appears start meloxicam once per day, Flexeril at bedtime if needed. Call me next week if this is not improving, we can consider a short course of prednisone. If any worsening of symptoms, return for recheck.  Generalized Anxiety Disorder Generalized anxiety disorder (GAD) is a mental disorder. It interferes with life functions, including relationships, work, and school. GAD is different from normal anxiety, which everyone experiences at some point in their lives in response to specific life events and activities. Normal anxiety actually helps [place] prepare for and get through these life events and activities. Normal anxiety goes away after the event or activity is over.  GAD causes anxiety that is not necessarily related to specific events or activities. It also causes excess anxiety in proportion to specific events or activities. The anxiety associated with GAD is also difficult to control. GAD can vary from mild to severe. People with severe GAD can have intense waves of anxiety with physical symptoms (panic attacks).  SYMPTOMS The anxiety and worry associated with GAD are difficult to control. This  anxiety and worry are related to many life events and activities and also occur more days than not for 6 months or longer. People with GAD also have three or more of the following symptoms (one  or more in children):  Restlessness.   Fatigue.  Difficulty concentrating.   Irritability.  Muscle tension.  Difficulty sleeping or unsatisfying sleep. DIAGNOSIS GAD is diagnosed through an assessment by your health care provider. Your health care provider will ask you questions aboutyour mood,physical symptoms, and events in your life. Your health care provider may ask you about your medical history and use of alcohol or drugs, including prescription medicines. Your health care provider may also do a physical exam and blood tests. Certain medical conditions and the use of certain substances can cause symptoms similar to those associated with GAD. Your health care provider may refer you to a mental health specialist for further evaluation. TREATMENT The following therapies are usually used to treat GAD:   Medication. Antidepressant medication usually is prescribed for long-term daily control. Antianxiety medicines may be added in severe cases, especially when panic attacks occur.   Talk therapy (psychotherapy). Certain types of talk therapy can be helpful in treating GAD by providing support, education, and guidance. A form of talk therapy called cognitive behavioral therapy can teach you healthy ways to think about and react to daily life events and activities.  Stress managementtechniques. These include yoga, meditation, and exercise and can be very helpful when they are practiced regularly. A mental health specialist can help determine which treatment is best for you. Some people see improvement with one therapy. However, other people require a combination of therapies.   This information is not intended to replace advice given to you by your health care provider. Make sure you discuss any  questions you have with your health care provider.   Document Released: 05/14/2012 Document Revised: 02/07/2014 Document Reviewed: 05/14/2012 Elsevier Interactive Patient Education 2016 [place] and Stress Management Stress is a normal reaction to life events. It is what you feel when life demands more than you are used to or more than you can handle. Some stress can be useful. For example, the stress reaction can help you catch the last bus of the day, study for a test, or meet a deadline at work. But stress that occurs too often or for too long can cause problems. It can affect your emotional health and interfere with relationships and normal daily activities. Too much stress can weaken your immune system and increase your risk for physical illness. If you already have a medical problem, stress can make it worse. CAUSES  All sorts of life events may cause stress. An event that causes stress for one person may not be stressful for another person. Major life events commonly cause stress. These may be positive or negative. Examples include losing your job, moving into a new home, getting married, having a baby, or losing a loved one. Less obvious life events may also cause stress, especially if they occur day after day or in combination. Examples include working long hours, driving in traffic, caring for children, being in debt, or being in a difficult relationship. SIGNS AND SYMPTOMS Stress may cause emotional symptoms including, the following:  Anxiety. This is feeling worried, afraid, on edge, overwhelmed, or out of control.  Anger. This is feeling irritated or impatient.  Depression. This is feeling sad, down, helpless, or guilty.  Difficulty focusing, remembering, or making decisions. Stress may cause physical symptoms, including the following:   Aches and pains. These may affect your head, neck, back, stomach, or other areas of your body.  Tight muscles or clenched  jaw.  Low energy or trouble  sleeping. Stress may cause unhealthy behaviors, including the following:   Eating to feel better (overeating) or skipping meals.  Sleeping too little, too much, or both.  Working too much or putting off tasks (procrastination).  Smoking, drinking alcohol, or using drugs to feel better. DIAGNOSIS  Stress is diagnosed through an assessment by your health care provider. Your health care provider will ask questions about your symptoms and any stressful life events.Your health care provider will also ask about your medical history and may order blood tests or other tests. Certain medical conditions and medicine can cause physical symptoms similar to stress. Mental illness can cause emotional symptoms and unhealthy behaviors similar to stress. Your health care provider may refer you to a mental health professional for further evaluation.  TREATMENT  Stress management is the recommended treatment for stress.The goals of stress management are reducing stressful life events and coping with stress in healthy ways.  Techniques for reducing stressful life events include the following:  Stress identification. Self-monitor for stress and identify what causes stress for you. These skills may help you to avoid some stressful events.  Time management. Set your priorities, keep a calendar of events, and learn to say "no." These tools can help you avoid making too many commitments. Techniques for coping with stress include the following:  Rethinking the problem. Try to think realistically about stressful events rather than ignoring them or overreacting. Try to find the positives in a stressful situation rather than focusing on the negatives.  Exercise. Physical exercise can release both physical and emotional tension. The key is to find a form of exercise you enjoy and do it regularly.  Relaxation techniques. These relax the body and mind. Examples include yoga, meditation,  tai chi, biofeedback, deep breathing, progressive muscle relaxation, listening to music, being out in nature, journaling, and other hobbies. Again, the key is to find one or more that you enjoy and can do regularly.  Healthy lifestyle. Eat a balanced diet, get plenty of sleep, and do not smoke. Avoid using alcohol or drugs to relax.  Strong support network. Spend time with family, friends, or other people you enjoy being around.Express your feelings and talk things over with someone you trust. Counseling or talktherapy with a mental health professional may be helpful if you are having difficulty managing stress on your own. Medicine is typically not recommended for the treatment of stress.Talk to your health care provider if you think you need medicine for symptoms of stress. HOME CARE INSTRUCTIONS  Keep all follow-up visits as directed by your health care provider.  Take all medicines as directed by your health care provider. SEEK MEDICAL CARE IF:  Your symptoms get worse or you start having new symptoms.  You feel overwhelmed by your problems and can no longer manage them on your own. SEEK IMMEDIATE MEDICAL CARE IF:  You feel like hurting yourself or someone else.   This information is not intended to replace advice given to you by your health care provider. Make sure you discuss any questions you have with your health care provider.   Document Released: 07/13/2000 Document Revised: 02/07/2014 Document Reviewed: 09/11/2012 Elsevier Interactive Patient Education 2016 [place] The sciatic nerve runs from the back down the leg and is responsible for sensation and control of the muscles in the back (posterior) side of the thigh, lower leg, and foot. Sciatica is a condition that is characterized by inflammation of this nerve.  SYMPTOMS   Signs of  nerve damage, including numbness and/or weakness along the posterior side of the lower extremity.  Pain in the  back of the thigh that may also travel down the leg.  Pain that worsens when sitting for long periods of time.  Occasionally, pain in the back or buttock. CAUSES  Inflammation of the sciatic nerve is the cause of sciatica. The inflammation is due to something irritating the nerve. Common sources of irritation include:  Sitting for long periods of time.  Direct trauma to the nerve.  Arthritis of the spine.  Herniated or ruptured disk.  Slipping of the vertebrae (spondylolisthesis).  Pressure from soft tissues, such as muscles or ligament-like tissue (fascia). RISK INCREASES WITH:  Sports that place pressure or stress on the spine (football or weightlifting).  Poor strength and flexibility.  Failure to warm up properly before activity.  Family history of low back pain or disk disorders.  Previous back injury or surgery.  Poor body mechanics, especially when lifting, or poor posture. PREVENTION   Warm up and stretch properly before activity.  Maintain physical fitness:  Strength, flexibility, and endurance.  Cardiovascular fitness.  Learn and use proper technique, especially with posture and lifting. When possible, have coach correct improper technique.  Avoid activities that place stress on the spine. PROGNOSIS If treated properly, then sciatica usually resolves within 6 weeks. However, occasionally surgery is necessary.  RELATED COMPLICATIONS   Permanent nerve damage, including pain, numbness, tingle, or weakness.  Chronic back pain.  Risks of surgery: infection, bleeding, nerve damage, or damage to surrounding tissues. TREATMENT Treatment initially involves resting from any activities that aggravate your symptoms. The use of ice and medication may help reduce pain and inflammation. The use of strengthening and stretching exercises may help reduce pain with activity. These exercises may be performed at home or with referral to a therapist. A therapist may  recommend further treatments, such as transcutaneous electronic nerve stimulation (TENS) or ultrasound. Your caregiver may recommend corticosteroid injections to help reduce inflammation of the sciatic nerve. If symptoms persist despite non-surgical (conservative) treatment, then surgery may be recommended. MEDICATION  If pain medication is necessary, then nonsteroidal anti-inflammatory medications, such as aspirin and ibuprofen, or other minor pain relievers, such as acetaminophen, are often recommended.  Do not take pain medication for 7 days before surgery.  Prescription pain relievers may be given if deemed necessary by your caregiver. Use only as directed and only as much as you need.  Ointments applied to the skin may be helpful.  Corticosteroid injections may be given by your caregiver. These injections should be reserved for the most serious cases, because they may only be given a certain number of times. HEAT AND COLD  Cold treatment (icing) relieves pain and reduces inflammation. Cold treatment should be applied for 10 to 15 minutes every 2 to 3 hours for inflammation and pain and immediately after any activity that aggravates your symptoms. Use ice packs or massage the area with a piece of ice (ice massage).  Heat treatment may be used prior to performing the stretching and strengthening activities prescribed by your caregiver, physical therapist, or athletic trainer. Use a heat pack or soak the injury in warm water. SEEK MEDICAL CARE IF:  Treatment seems to offer no benefit, or the condition worsens.  Any medications produce adverse side effects. EXERCISES  RANGE OF MOTION (ROM) AND STRETCHING EXERCISES - Sciatica Most people with sciatic will find that their symptoms worsen with either excessive bending forward (flexion) or arching at  the low back (extension). The exercises which will help resolve your symptoms will focus on the opposite motion. Your physician, physical therapist  or athletic trainer will help you determine which exercises will be most helpful to resolve your low back pain. Do not complete any exercises without first consulting with your clinician. Discontinue any exercises which worsen your symptoms until you speak to your clinician. If you have pain, numbness or tingling which travels down into your buttocks, leg or foot, the goal of the therapy is for these symptoms to move closer to your back and eventually resolve. Occasionally, these leg symptoms will get better, but your low back pain may worsen; this is typically an indication of progress in your rehabilitation. Be certain to be very alert to any changes in your symptoms and the activities in which you participated in the 24 hours prior to the change. Sharing this information with your clinician will allow him/her to most efficiently treat your condition. These exercises may help you when beginning to rehabilitate your injury. Your symptoms may resolve with or without further involvement from your physician, physical therapist or athletic trainer. While completing these exercises, remember:   Restoring tissue flexibility helps normal motion to return to the joints. This allows healthier, less painful movement and activity.  An effective stretch should be held for at least 30 seconds.  A stretch should never be painful. You should only feel a gentle lengthening or release in the stretched tissue. FLEXION RANGE OF MOTION AND STRETCHING EXERCISES: STRETCH - Flexion, Single Knee to Chest   Lie on a firm bed or floor with both legs extended in front of you.  Keeping one leg in contact with the floor, bring your opposite knee to your chest. Hold your leg in place by either grabbing behind your thigh or at your knee.  Pull until you feel a gentle stretch in your low back. Hold __________ seconds.  Slowly release your grasp and repeat the exercise with the opposite side. Repeat __________ times. Complete  this exercise __________ times per day.  STRETCH - Flexion, Double Knee to Chest  Lie on a firm bed or floor with both legs extended in front of you.  Keeping one leg in contact with the floor, bring your opposite knee to your chest.  Tense your stomach muscles to support your back and then lift your other knee to your chest. Hold your legs in place by either grabbing behind your thighs or at your knees.  Pull both knees toward your chest until you feel a gentle stretch in your low back. Hold __________ seconds.  Tense your stomach muscles and slowly return one leg at a time to the floor. Repeat __________ times. Complete this exercise __________ times per day.  STRETCH - Low Trunk Rotation   Lie on a firm bed or floor. Keeping your legs in front of you, bend your knees so they are both pointed toward the ceiling and your feet are flat on the floor.  Extend your arms out to the side. This will stabilize your upper body by keeping your shoulders in contact with the floor.  Gently and slowly drop both knees together to one side until you feel a gentle stretch in your low back. Hold for __________ seconds.  Tense your stomach muscles to support your low back as you bring your knees back to the starting position. Repeat the exercise to the other side. Repeat __________ times. Complete this exercise __________ times per day  EXTENSION  RANGE OF MOTION AND FLEXIBILITY EXERCISES: STRETCH - Extension, Prone on Elbows  Lie on your stomach on the floor, a bed will be too soft. Place your palms about shoulder width apart and at the height of your head.  Place your elbows under your shoulders. If this is too painful, stack pillows under your chest.  Allow your body to relax so that your hips drop lower and make contact more completely with the floor.  Hold this position for __________ seconds.  Slowly return to lying flat on the floor. Repeat __________ times. Complete this exercise __________  times per day.  RANGE OF MOTION - Extension, Prone Press Ups  Lie on your stomach on the floor, a bed will be too soft. Place your palms about shoulder width apart and at the height of your head.  Keeping your back as relaxed as possible, slowly straighten your elbows while keeping your hips on the floor. You may adjust the placement of your hands to maximize your comfort. As you gain motion, your hands will come more underneath your shoulders.  Hold this position __________ seconds.  Slowly return to lying flat on the floor. Repeat __________ times. Complete this exercise __________ times per day.  STRENGTHENING EXERCISES - Sciatica  These exercises may help you when beginning to rehabilitate your injury. These exercises should be done near your "sweet spot." This is the neutral, low-back arch, somewhere between fully rounded and fully arched, that is your least painful position. When performed in this safe range of motion, these exercises can be used for people who have either a flexion or extension based injury. These exercises may resolve your symptoms with or without further involvement from your physician, physical therapist or athletic trainer. While completing these exercises, remember:   Muscles can gain both the endurance and the strength needed for everyday activities through controlled exercises.  Complete these exercises as instructed by your physician, physical therapist or athletic trainer. Progress with the resistance and repetition exercises only as your caregiver advises.  You may experience muscle soreness or fatigue, but the pain or discomfort you are trying to eliminate should never worsen during these exercises. If this pain does worsen, stop and make certain you are following the directions exactly. If the pain is still present after adjustments, discontinue the exercise until you can discuss the trouble with your clinician. STRENGTHENING - Deep Abdominals, Pelvic Tilt    Lie on a firm bed or floor. Keeping your legs in front of you, bend your knees so they are both pointed toward the ceiling and your feet are flat on the floor.  Tense your lower abdominal muscles to press your low back into the floor. This motion will rotate your pelvis so that your tail bone is scooping upwards rather than pointing at your feet or into the floor.  With a gentle tension and even breathing, hold this position for __________ seconds. Repeat __________ times. Complete this exercise __________ times per day.  STRENGTHENING - Abdominals, Crunches   Lie on a firm bed or floor. Keeping your legs in front of you, bend your knees so they are both pointed toward the ceiling and your feet are flat on the floor. Cross your arms over your chest.  Slightly tip your chin down without bending your neck.  Tense your abdominals and slowly lift your trunk high enough to just clear your shoulder blades. Lifting higher can put excessive stress on the low back and does not further strengthen your abdominal muscles.  Control your return to the starting position. Repeat __________ times. Complete this exercise __________ times per day.  STRENGTHENING - Quadruped, Opposite UE/LE Lift  Assume a hands and knees position on a firm surface. Keep your hands under your shoulders and your knees under your hips. You may place padding under your knees for comfort.  Find your neutral spine and gently tense your abdominal muscles so that you can maintain this position. Your shoulders and hips should form a rectangle that is parallel with the floor and is not twisted.  Keeping your trunk steady, lift your right hand no higher than your shoulder and then your left leg no higher than your hip. Make sure you are not holding your breath. Hold this position __________ seconds.  Continuing to keep your abdominal muscles tense and your back steady, slowly return to your starting position. Repeat with the opposite  arm and leg. Repeat __________ times. Complete this exercise __________ times per day.  STRENGTHENING - Abdominals and Quadriceps, Straight Leg Raise   Lie on a firm bed or floor with both legs extended in front of you.  Keeping one leg in contact with the floor, bend the other knee so that your foot can rest flat on the floor.  Find your neutral spine, and tense your abdominal muscles to maintain your spinal position throughout the exercise.  Slowly lift your straight leg off the floor about 6 inches for a count of 15, making sure to not hold your breath.  Still keeping your neutral spine, slowly lower your leg all the way to the floor. Repeat this exercise with each leg __________ times. Complete this exercise __________ times per day. POSTURE AND BODY MECHANICS CONSIDERATIONS - Sciatica Keeping correct posture when sitting, standing or completing your activities will reduce the stress put on different body tissues, allowing injured tissues a chance to heal and limiting painful experiences. The following are general guidelines for improved posture. Your physician or physical therapist will provide you with any instructions specific to your needs. While reading these guidelines, remember:  The exercises prescribed by your provider will help you have the flexibility and strength to maintain correct postures.  The correct posture provides the optimal environment for your joints to work. All of your joints have less wear and tear when properly supported by a spine with good posture. This means you will experience a healthier, less painful body.  Correct posture must be practiced with all of your activities, especially prolonged sitting and standing. Correct posture is as important when doing repetitive low-stress activities (typing) as it is when doing a single heavy-load activity (lifting). RESTING POSITIONS Consider which positions are most painful for you when choosing a resting position. If  you have pain with flexion-based activities (sitting, bending, stooping, squatting), choose a position that allows you to rest in a less flexed posture. You would want to avoid curling into a fetal position on your side. If your pain worsens with extension-based activities (prolonged standing, working overhead), avoid resting in an extended position such as sleeping on your stomach. Most people will find more comfort when they rest with their spine in a more neutral position, neither too rounded nor too arched. Lying on a non-sagging bed on your side with a pillow between your knees, or on your back with a pillow under your knees will often provide some relief. Keep in mind, being in any one position for a prolonged period of time, no matter how correct your posture, can still lead  to stiffness. PROPER SITTING POSTURE In order to minimize stress and discomfort on your spine, you must sit with correct posture Sitting with good posture should be effortless for a healthy body. Returning to good posture is a gradual process. Many people can work toward this most comfortably by using various supports until they have the flexibility and strength to maintain this posture on their own. When sitting with proper posture, your ears will fall over your shoulders and your shoulders will fall over your hips. You should use the back of the chair to support your upper back. Your low back will be in a neutral position, just slightly arched. You may place a small pillow or folded towel at the base of your low back for support.  When working at a desk, create an environment that supports good, upright posture. Without extra support, muscles fatigue and lead to excessive strain on joints and other tissues. Keep these recommendations in mind: CHAIR:   A chair should be able to slide under your desk when your back makes contact with the back of the chair. This allows you to work closely.  The chair's height should allow your  eyes to be level with the upper part of your monitor and your hands to be slightly lower than your elbows. BODY POSITION  Your feet should make contact with the floor. If this is not possible, use a foot rest.  Keep your ears over your shoulders. This will reduce stress on your neck and low back. INCORRECT SITTING POSTURES   If you are feeling tired and unable to assume a healthy sitting posture, do not slouch or slump. This puts excessive strain on your back tissues, causing more damage and pain. Healthier options include:  Using more support, like a lumbar pillow.  Switching tasks to something that requires you to be upright or walking.  Talking a brief walk.  Lying down to rest in a neutral-spine position. PROLONGED STANDING WHILE SLIGHTLY LEANING FORWARD  When completing a task that requires you to lean forward while standing in one place for a long time, place either foot up on a stationary 2-4 inch high object to help maintain the best posture. When both feet are on the ground, the low back tends to lose its slight inward curve. If this curve flattens (or becomes too large), then the back and your other joints will experience too much stress, fatigue more quickly and can cause pain.  CORRECT STANDING POSTURES Proper standing posture should be assumed with all daily activities, even if they only take a few moments, like when brushing your teeth. As in sitting, your ears should fall over your shoulders and your shoulders should fall over your hips. You should keep a slight tension in your abdominal muscles to brace your spine. Your tailbone should point down to the ground, not behind your body, resulting in an over-extended swayback posture.  INCORRECT STANDING POSTURES  Common incorrect standing postures include a forward head, locked knees and/or an excessive swayback. WALKING Walk with an upright posture. Your ears, shoulders and hips should all line-up. PROLONGED ACTIVITY IN A FLEXED  POSITION When completing a task that requires you to bend forward at your waist or lean over a low surface, try to find a way to stabilize 3 of 4 of your limbs. You can place a hand or elbow on your thigh or rest a knee on the surface you are reaching across. This will provide you more stability so that your muscles  do not fatigue as quickly. By keeping your knees relaxed, or slightly bent, you will also reduce stress across your low back. CORRECT LIFTING TECHNIQUES DO :   Assume a wide stance. This will provide you more stability and the opportunity to get as close as possible to the object which you are lifting.  Tense your abdominals to brace your spine; then bend at the knees and hips. Keeping your back locked in a neutral-spine position, lift using your leg muscles. Lift with your legs, keeping your back straight.  Test the weight of unknown objects before attempting to lift them.  Try to keep your elbows locked down at your sides in order get the best strength from your shoulders when carrying an object.  Always ask for help when lifting heavy or awkward objects. INCORRECT LIFTING TECHNIQUES DO NOT:   Lock your knees when lifting, even if it is a small object.  Bend and twist. Pivot at your feet or move your feet when needing to change directions.  Assume that you cannot safely pick up a paperclip without proper posture.   This information is not intended to replace advice given to you by your health care provider. Make sure you discuss any questions you have with your health care provider.   Document Released: 01/17/2005 Document Revised: 06/03/2014 Document Reviewed: 05/01/2008 [employer] Interactive Patient Education [employer].      I personally performed the services described in this documentation, which was scribed in my presence. The recorded information has been reviewed and considered, and addended by me as needed.

## 2015-05-11 NOTE — Patient Instructions (Addendum)
IF you received an x-ray today, you will receive an invoice from [facility]. Please contact [facility] at [phone] with questions or concerns regarding your invoice.   IF you received labwork today, you will receive an invoice from [employer]. Please contact [facility] at [phone] with questions or concerns regarding your invoice.   Our billing staff will not be able to assist you with questions regarding bills from these companies.  You will be contacted with the lab results as soon as they are available. The fastest way to get your results is to activate your My Chart account. Instructions are located on the last page of this paperwork. If you have not heard from [place] regarding the results in 2 weeks, please contact this office.     Start Zoloft once per day, see information below on stress and stress management, as well as phone numbers for counseling. Follow-up with me in the next 3-4 weeks. If you do have panic or anxiety attacks again, I did write for short course of Xanax, but plan for this as needed only.    Vivia Budge: [phone] Arvil Chaco: [phone]   Symptoms to some sciatic appears start meloxicam once per day, Flexeril at bedtime if needed. Call me next week if this is not improving, we can consider a short course of prednisone. If any worsening of symptoms, return for recheck.  Generalized Anxiety Disorder Generalized anxiety disorder (GAD) is a mental disorder. It interferes with life functions, including relationships, work, and school. GAD is different from normal anxiety, which everyone experiences at some point in their lives in response to specific life events and activities. Normal anxiety actually helps [place] prepare for and get through these life events and activities. Normal anxiety goes away after the event or activity is over.  GAD causes anxiety that is not necessarily related to specific events or  activities. It also causes excess anxiety in proportion to specific events or activities. The anxiety associated with GAD is also difficult to control. GAD can vary from mild to severe. People with severe GAD can have intense waves of anxiety with physical symptoms (panic attacks).  SYMPTOMS The anxiety and worry associated with GAD are difficult to control. This anxiety and worry are related to many life events and activities and also occur more days than not for 6 months or longer. People with GAD also have three or more of the following symptoms (one or more in children):  Restlessness.   Fatigue.  Difficulty concentrating.   Irritability.  Muscle tension.  Difficulty sleeping or unsatisfying sleep. DIAGNOSIS GAD is diagnosed through an assessment by your health care provider. Your health care provider will ask you questions aboutyour mood,physical symptoms, and events in your life. Your health care provider may ask you about your medical history and use of alcohol or drugs, including prescription medicines. Your health care provider may also do a physical exam and blood tests. Certain medical conditions and the use of certain substances can cause symptoms similar to those associated with GAD. Your health care provider may refer you to a mental health specialist for further evaluation. TREATMENT The following therapies are usually used to treat GAD:   Medication. Antidepressant medication usually is prescribed for long-term daily control. Antianxiety medicines may be added in severe cases, especially when panic attacks occur.   Talk therapy (psychotherapy). Certain types of talk therapy can be helpful in treating GAD by providing support, education, and guidance. A form of talk  therapy called cognitive behavioral therapy can teach you healthy ways to think about and react to daily life events and activities.  Stress managementtechniques. These include yoga, meditation, and exercise  and can be very helpful when they are practiced regularly. A mental health specialist can help determine which treatment is best for you. Some people see improvement with one therapy. However, other people require a combination of therapies.   This information is not intended to replace advice given to you by your health care provider. Make sure you discuss any questions you have with your health care provider.   Document Released: 05/14/2012 Document Revised: 02/07/2014 Document Reviewed: 05/14/2012 Elsevier Interactive Patient Education 2016 [place] and Stress Management Stress is a normal reaction to life events. It is what you feel when life demands more than you are used to or more than you can handle. Some stress can be useful. For example, the stress reaction can help you catch the last bus of the day, study for a test, or meet a deadline at work. But stress that occurs too often or for too long can cause problems. It can affect your emotional health and interfere with relationships and normal daily activities. Too much stress can weaken your immune system and increase your risk for physical illness. If you already have a medical problem, stress can make it worse. CAUSES  All sorts of life events may cause stress. An event that causes stress for one person may not be stressful for another person. Major life events commonly cause stress. These may be positive or negative. Examples include losing your job, moving into a new home, getting married, having a baby, or losing a loved one. Less obvious life events may also cause stress, especially if they occur day after day or in combination. Examples include working long hours, driving in traffic, caring for children, being in debt, or being in a difficult relationship. SIGNS AND SYMPTOMS Stress may cause emotional symptoms including, the following:  Anxiety. This is feeling worried, afraid, on edge, overwhelmed, or out of  control.  Anger. This is feeling irritated or impatient.  Depression. This is feeling sad, down, helpless, or guilty.  Difficulty focusing, remembering, or making decisions. Stress may cause physical symptoms, including the following:   Aches and pains. These may affect your head, neck, back, stomach, or other areas of your body.  Tight muscles or clenched jaw.  Low energy or trouble sleeping. Stress may cause unhealthy behaviors, including the following:   Eating to feel better (overeating) or skipping meals.  Sleeping too little, too much, or both.  Working too much or putting off tasks (procrastination).  Smoking, drinking alcohol, or using drugs to feel better. DIAGNOSIS  Stress is diagnosed through an assessment by your health care provider. Your health care provider will ask questions about your symptoms and any stressful life events.Your health care provider will also ask about your medical history and may order blood tests or other tests. Certain medical conditions and medicine can cause physical symptoms similar to stress. Mental illness can cause emotional symptoms and unhealthy behaviors similar to stress. Your health care provider may refer you to a mental health professional for further evaluation.  TREATMENT  Stress management is the recommended treatment for stress.The goals of stress management are reducing stressful life events and coping with stress in healthy ways.  Techniques for reducing stressful life events include the following:  Stress identification. Self-monitor for stress and identify what causes stress  for you. These skills may help you to avoid some stressful events.  Time management. Set your priorities, keep a calendar of events, and learn to say "no." These tools can help you avoid making too many commitments. Techniques for coping with stress include the following:  Rethinking the problem. Try to think realistically about stressful events rather  than ignoring them or overreacting. Try to find the positives in a stressful situation rather than focusing on the negatives.  Exercise. Physical exercise can release both physical and emotional tension. The key is to find a form of exercise you enjoy and do it regularly.  Relaxation techniques. These relax the body and mind. Examples include yoga, meditation, tai chi, biofeedback, deep breathing, progressive muscle relaxation, listening to music, being out in nature, journaling, and other hobbies. Again, the key is to find one or more that you enjoy and can do regularly.  Healthy lifestyle. Eat a balanced diet, get plenty of sleep, and do not smoke. Avoid using alcohol or drugs to relax.  Strong support network. Spend time with family, friends, or other people you enjoy being around.Express your feelings and talk things over with someone you trust. Counseling or talktherapy with a mental health professional may be helpful if you are having difficulty managing stress on your own. Medicine is typically not recommended for the treatment of stress.Talk to your health care provider if you think you need medicine for symptoms of stress. HOME CARE INSTRUCTIONS  Keep all follow-up visits as directed by your health care provider.  Take all medicines as directed by your health care provider. SEEK MEDICAL CARE IF:  Your symptoms get worse or you start having new symptoms.  You feel overwhelmed by your problems and can no longer manage them on your own. SEEK IMMEDIATE MEDICAL CARE IF:  You feel like hurting yourself or someone else.   This information is not intended to replace advice given to you by your health care provider. Make sure you discuss any questions you have with your health care provider.   Document Released: 07/13/2000 Document Revised: 02/07/2014 Document Reviewed: 09/11/2012 Elsevier Interactive Patient Education 2016 [place] The sciatic nerve runs  from the back down the leg and is responsible for sensation and control of the muscles in the back (posterior) side of the thigh, lower leg, and foot. Sciatica is a condition that is characterized by inflammation of this nerve.  SYMPTOMS   Signs of nerve damage, including numbness and/or weakness along the posterior side of the lower extremity.  Pain in the back of the thigh that may also travel down the leg.  Pain that worsens when sitting for long periods of time.  Occasionally, pain in the back or buttock. CAUSES  Inflammation of the sciatic nerve is the cause of sciatica. The inflammation is due to something irritating the nerve. Common sources of irritation include:  Sitting for long periods of time.  Direct trauma to the nerve.  Arthritis of the spine.  Herniated or ruptured disk.  Slipping of the vertebrae (spondylolisthesis).  Pressure from soft tissues, such as muscles or ligament-like tissue (fascia). RISK INCREASES WITH:  Sports that place pressure or stress on the spine (football or weightlifting).  Poor strength and flexibility.  Failure to warm up properly before activity.  Family history of low back pain or disk disorders.  Previous back injury or surgery.  Poor body mechanics, especially when lifting, or poor posture. PREVENTION   Warm up and stretch properly  before activity.  Maintain physical fitness:  Strength, flexibility, and endurance.  Cardiovascular fitness.  Learn and use proper technique, especially with posture and lifting. When possible, have coach correct improper technique.  Avoid activities that place stress on the spine. PROGNOSIS If treated properly, then sciatica usually resolves within 6 weeks. However, occasionally surgery is necessary.  RELATED COMPLICATIONS   Permanent nerve damage, including pain, numbness, tingle, or weakness.  Chronic back pain.  Risks of surgery: infection, bleeding, nerve damage, or damage to  surrounding tissues. TREATMENT Treatment initially involves resting from any activities that aggravate your symptoms. The use of ice and medication may help reduce pain and inflammation. The use of strengthening and stretching exercises may help reduce pain with activity. These exercises may be performed at home or with referral to a therapist. A therapist may recommend further treatments, such as transcutaneous electronic nerve stimulation (TENS) or ultrasound. Your caregiver may recommend corticosteroid injections to help reduce inflammation of the sciatic nerve. If symptoms persist despite non-surgical (conservative) treatment, then surgery may be recommended. MEDICATION  If pain medication is necessary, then nonsteroidal anti-inflammatory medications, such as aspirin and ibuprofen, or other minor pain relievers, such as acetaminophen, are often recommended.  Do not take pain medication for 7 days before surgery.  Prescription pain relievers may be given if deemed necessary by your caregiver. Use only as directed and only as much as you need.  Ointments applied to the skin may be helpful.  Corticosteroid injections may be given by your caregiver. These injections should be reserved for the most serious cases, because they may only be given a certain number of times. HEAT AND COLD  Cold treatment (icing) relieves pain and reduces inflammation. Cold treatment should be applied for 10 to 15 minutes every 2 to 3 hours for inflammation and pain and immediately after any activity that aggravates your symptoms. Use ice packs or massage the area with a piece of ice (ice massage).  Heat treatment may be used prior to performing the stretching and strengthening activities prescribed by your caregiver, physical therapist, or athletic trainer. Use a heat Kariann Wecker or soak the injury in warm water. SEEK MEDICAL CARE IF:  Treatment seems to offer no benefit, or the condition worsens.  Any medications produce  adverse side effects. EXERCISES  RANGE OF MOTION (ROM) AND STRETCHING EXERCISES - Sciatica Most people with sciatic will find that their symptoms worsen with either excessive bending forward (flexion) or arching at the low back (extension). The exercises which will help resolve your symptoms will focus on the opposite motion. Your physician, physical therapist or athletic trainer will help you determine which exercises will be most helpful to resolve your low back pain. Do not complete any exercises without first consulting with your clinician. Discontinue any exercises which worsen your symptoms until you speak to your clinician. If you have pain, numbness or tingling which travels down into your buttocks, leg or foot, the goal of the therapy is for these symptoms to move closer to your back and eventually resolve. Occasionally, these leg symptoms will get better, but your low back pain may worsen; this is typically an indication of progress in your rehabilitation. Be certain to be very alert to any changes in your symptoms and the activities in which you participated in the 24 hours prior to the change. Sharing this information with your clinician will allow him/her to most efficiently treat your condition. These exercises may help you when beginning to rehabilitate your injury. Your symptoms  may resolve with or without further involvement from your physician, physical therapist or athletic trainer. While completing these exercises, remember:   Restoring tissue flexibility helps normal motion to return to the joints. This allows healthier, less painful movement and activity.  An effective stretch should be held for at least 30 seconds.  A stretch should never be painful. You should only feel a gentle lengthening or release in the stretched tissue. FLEXION RANGE OF MOTION AND STRETCHING EXERCISES: STRETCH - Flexion, Single Knee to Chest   Lie on a firm bed or floor with both legs extended in front of  you.  Keeping one leg in contact with the floor, bring your opposite knee to your chest. Hold your leg in place by either grabbing behind your thigh or at your knee.  Pull until you feel a gentle stretch in your low back. Hold __________ seconds.  Slowly release your grasp and repeat the exercise with the opposite side. Repeat __________ times. Complete this exercise __________ times per day.  STRETCH - Flexion, Double Knee to Chest  Lie on a firm bed or floor with both legs extended in front of you.  Keeping one leg in contact with the floor, bring your opposite knee to your chest.  Tense your stomach muscles to support your back and then lift your other knee to your chest. Hold your legs in place by either grabbing behind your thighs or at your knees.  Pull both knees toward your chest until you feel a gentle stretch in your low back. Hold __________ seconds.  Tense your stomach muscles and slowly return one leg at a time to the floor. Repeat __________ times. Complete this exercise __________ times per day.  STRETCH - Low Trunk Rotation   Lie on a firm bed or floor. Keeping your legs in front of you, bend your knees so they are both pointed toward the ceiling and your feet are flat on the floor.  Extend your arms out to the side. This will stabilize your upper body by keeping your shoulders in contact with the floor.  Gently and slowly drop both knees together to one side until you feel a gentle stretch in your low back. Hold for __________ seconds.  Tense your stomach muscles to support your low back as you bring your knees back to the starting position. Repeat the exercise to the other side. Repeat __________ times. Complete this exercise __________ times per day  EXTENSION RANGE OF MOTION AND FLEXIBILITY EXERCISES: STRETCH - Extension, Prone on Elbows  Lie on your stomach on the floor, a bed will be too soft. Place your palms about shoulder width apart and at the height of your  head.  Place your elbows under your shoulders. If this is too painful, stack pillows under your chest.  Allow your body to relax so that your hips drop lower and make contact more completely with the floor.  Hold this position for __________ seconds.  Slowly return to lying flat on the floor. Repeat __________ times. Complete this exercise __________ times per day.  RANGE OF MOTION - Extension, Prone Press Ups  Lie on your stomach on the floor, a bed will be too soft. Place your palms about shoulder width apart and at the height of your head.  Keeping your back as relaxed as possible, slowly straighten your elbows while keeping your hips on the floor. You may adjust the placement of your hands to maximize your comfort. As you gain motion, your hands will  come more underneath your shoulders.  Hold this position __________ seconds.  Slowly return to lying flat on the floor. Repeat __________ times. Complete this exercise __________ times per day.  STRENGTHENING EXERCISES - Sciatica  These exercises may help you when beginning to rehabilitate your injury. These exercises should be done near your "sweet spot." This is the neutral, low-back arch, somewhere between fully rounded and fully arched, that is your least painful position. When performed in this safe range of motion, these exercises can be used for people who have either a flexion or extension based injury. These exercises may resolve your symptoms with or without further involvement from your physician, physical therapist or athletic trainer. While completing these exercises, remember:   Muscles can gain both the endurance and the strength needed for everyday activities through controlled exercises.  Complete these exercises as instructed by your physician, physical therapist or athletic trainer. Progress with the resistance and repetition exercises only as your caregiver advises.  You may experience muscle soreness or fatigue, but  the pain or discomfort you are trying to eliminate should never worsen during these exercises. If this pain does worsen, stop and make certain you are following the directions exactly. If the pain is still present after adjustments, discontinue the exercise until you can discuss the trouble with your clinician. STRENGTHENING - Deep Abdominals, Pelvic Tilt   Lie on a firm bed or floor. Keeping your legs in front of you, bend your knees so they are both pointed toward the ceiling and your feet are flat on the floor.  Tense your lower abdominal muscles to press your low back into the floor. This motion will rotate your pelvis so that your tail bone is scooping upwards rather than pointing at your feet or into the floor.  With a gentle tension and even breathing, hold this position for __________ seconds. Repeat __________ times. Complete this exercise __________ times per day.  STRENGTHENING - Abdominals, Crunches   Lie on a firm bed or floor. Keeping your legs in front of you, bend your knees so they are both pointed toward the ceiling and your feet are flat on the floor. Cross your arms over your chest.  Slightly tip your chin down without bending your neck.  Tense your abdominals and slowly lift your trunk high enough to just clear your shoulder blades. Lifting higher can put excessive stress on the low back and does not further strengthen your abdominal muscles.  Control your return to the starting position. Repeat __________ times. Complete this exercise __________ times per day.  STRENGTHENING - Quadruped, Opposite UE/LE Lift  Assume a hands and knees position on a firm surface. Keep your hands under your shoulders and your knees under your hips. You may place padding under your knees for comfort.  Find your neutral spine and gently tense your abdominal muscles so that you can maintain this position. Your shoulders and hips should form a rectangle that is parallel with the floor and is not  twisted.  Keeping your trunk steady, lift your right hand no higher than your shoulder and then your left leg no higher than your hip. Make sure you are not holding your breath. Hold this position __________ seconds.  Continuing to keep your abdominal muscles tense and your back steady, slowly return to your starting position. Repeat with the opposite arm and leg. Repeat __________ times. Complete this exercise __________ times per day.  STRENGTHENING - Abdominals and Quadriceps, Straight Leg Raise   Lie on  a firm bed or floor with both legs extended in front of you.  Keeping one leg in contact with the floor, bend the other knee so that your foot can rest flat on the floor.  Find your neutral spine, and tense your abdominal muscles to maintain your spinal position throughout the exercise.  Slowly lift your straight leg off the floor about 6 inches for a count of 15, making sure to not hold your breath.  Still keeping your neutral spine, slowly lower your leg all the way to the floor. Repeat this exercise with each leg __________ times. Complete this exercise __________ times per day. POSTURE AND BODY MECHANICS CONSIDERATIONS - Sciatica Keeping correct posture when sitting, standing or completing your activities will reduce the stress put on different body tissues, allowing injured tissues a chance to heal and limiting painful experiences. The following are general guidelines for improved posture. Your physician or physical therapist will provide you with any instructions specific to your needs. While reading these guidelines, remember:  The exercises prescribed by your provider will help you have the flexibility and strength to maintain correct postures.  The correct posture provides the optimal environment for your joints to work. All of your joints have less wear and tear when properly supported by a spine with good posture. This means you will experience a healthier, less painful  body.  Correct posture must be practiced with all of your activities, especially prolonged sitting and standing. Correct posture is as important when doing repetitive low-stress activities (typing) as it is when doing a single heavy-load activity (lifting). RESTING POSITIONS Consider which positions are most painful for you when choosing a resting position. If you have pain with flexion-based activities (sitting, bending, stooping, squatting), choose a position that allows you to rest in a less flexed posture. You would want to avoid curling into a fetal position on your side. If your pain worsens with extension-based activities (prolonged standing, working overhead), avoid resting in an extended position such as sleeping on your stomach. Most people will find more comfort when they rest with their spine in a more neutral position, neither too rounded nor too arched. Lying on a non-sagging bed on your side with a pillow between your knees, or on your back with a pillow under your knees will often provide some relief. Keep in mind, being in any one position for a prolonged period of time, no matter how correct your posture, can still lead to stiffness. PROPER SITTING POSTURE In order to minimize stress and discomfort on your spine, you must sit with correct posture Sitting with good posture should be effortless for a healthy body. Returning to good posture is a gradual process. Many people can work toward this most comfortably by using various supports until they have the flexibility and strength to maintain this posture on their own. When sitting with proper posture, your ears will fall over your shoulders and your shoulders will fall over your hips. You should use the back of the chair to support your upper back. Your low back will be in a neutral position, just slightly arched. You may place a small pillow or folded towel at the base of your low back for support.  When working at a desk, create an  environment that supports good, upright posture. Without extra support, muscles fatigue and lead to excessive strain on joints and other tissues. Keep these recommendations in mind: CHAIR:   A chair should be able to slide under your desk when  your back makes contact with the back of the chair. This allows you to work closely.  The chair's height should allow your eyes to be level with the upper part of your monitor and your hands to be slightly lower than your elbows. BODY POSITION  Your feet should make contact with the floor. If this is not possible, use a foot rest.  Keep your ears over your shoulders. This will reduce stress on your neck and low back. INCORRECT SITTING POSTURES   If you are feeling tired and unable to assume a healthy sitting posture, do not slouch or slump. This puts excessive strain on your back tissues, causing more damage and pain. Healthier options include:  Using more support, like a lumbar pillow.  Switching tasks to something that requires you to be upright or walking.  Talking a brief walk.  Lying down to rest in a neutral-spine position. PROLONGED STANDING WHILE SLIGHTLY LEANING FORWARD  When completing a task that requires you to lean forward while standing in one place for a long time, place either foot up on a stationary 2-4 inch high object to help maintain the best posture. When both feet are on the ground, the low back tends to lose its slight inward curve. If this curve flattens (or becomes too large), then the back and your other joints will experience too much stress, fatigue more quickly and can cause pain.  CORRECT STANDING POSTURES Proper standing posture should be assumed with all daily activities, even if they only take a few moments, like when brushing your teeth. As in sitting, your ears should fall over your shoulders and your shoulders should fall over your hips. You should keep a slight tension in your abdominal muscles to brace your spine.  Your tailbone should point down to the ground, not behind your body, resulting in an over-extended swayback posture.  INCORRECT STANDING POSTURES  Common incorrect standing postures include a forward head, locked knees and/or an excessive swayback. WALKING Walk with an upright posture. Your ears, shoulders and hips should all line-up. PROLONGED ACTIVITY IN A FLEXED POSITION When completing a task that requires you to bend forward at your waist or lean over a low surface, try to find a way to stabilize 3 of 4 of your limbs. You can place a hand or elbow on your thigh or rest a knee on the surface you are reaching across. This will provide you more stability so that your muscles do not fatigue as quickly. By keeping your knees relaxed, or slightly bent, you will also reduce stress across your low back. CORRECT LIFTING TECHNIQUES DO :   Assume a wide stance. This will provide you more stability and the opportunity to get as close as possible to the object which you are lifting.  Tense your abdominals to brace your spine; then bend at the knees and hips. Keeping your back locked in a neutral-spine position, lift using your leg muscles. Lift with your legs, keeping your back straight.  Test the weight of unknown objects before attempting to lift them.  Try to keep your elbows locked down at your sides in order get the best strength from your shoulders when carrying an object.  Always ask for help when lifting heavy or awkward objects. INCORRECT LIFTING TECHNIQUES DO NOT:   Lock your knees when lifting, even if it is a small object.  Bend and twist. Pivot at your feet or move your feet when needing to change directions.  Assume that you  cannot safely pick up a paperclip without proper posture.   This information is not intended to replace advice given to you by your health care provider. Make sure you discuss any questions you have with your health care provider.   Document Released:  01/17/2005 Document Revised: 06/03/2014 Document Reviewed: 05/01/2008 Elsevier Interactive Patient Education [employer].

## 2015-06-30 MED FILL — QVAR 40 MCG ORAL INHALER: 40 | 30 days supply | Qty: 9 | Fill #3

## 2015-06-30 MED FILL — ALPRAZolam 0.25 MG TABS: 0.25 | 10 days supply | Qty: 20 | Fill #0

## 2015-08-12 MED FILL — VENTOLIN HFA 90 MCG INHALER: 108 (90 BAS | 30 days supply | Qty: 18 | Fill #3

## 2015-08-12 MED FILL — QVAR 40 MCG ORAL INHALER: 40 | 30 days supply | Qty: 9 | Fill #4

## 2015-08-27 ENCOUNTER — Telehealth: Payer: Self-pay | Admitting: Family Medicine

## 2015-08-27 DIAGNOSIS — F411 Generalized anxiety disorder: Secondary | ICD-10-CM

## 2015-08-27 DIAGNOSIS — F418 Other specified anxiety disorders: Secondary | ICD-10-CM

## 2015-08-27 DIAGNOSIS — F410 Panic disorder [episodic paroxysmal anxiety] without agoraphobia: Secondary | ICD-10-CM

## 2015-08-28 MED ORDER — ALPRAZOLAM 0.25 MG PO TABS: 0.2500 mg | ORAL_TABLET | Freq: Two times a day (BID) | ORAL | 0 refills | Status: DC | PRN

## 2015-08-28 NOTE — Telephone Encounter (Signed)
I will prescribe another 20, but based on our last office visit in April, I wanted to see him back within a few weeks. Please follow-up prior to this prescription running out to discuss plan.

## 2015-08-28 NOTE — Telephone Encounter (Signed)
Faxed. LMOM on cell # to advise of need for f/up before this RF runs out and explained same day appts

## 2015-09-22 ENCOUNTER — Telehealth: Payer: Self-pay

## 2015-09-22 NOTE — Telephone Encounter (Signed)
Pt received a call from [place] that the refill he was getting was sent to pharmacy and when he called pharmacy they do not have it and would like for us to resend it   Best number [phone]

## 2015-09-24 MED FILL — ALPRAZolam 0.25 MG TABS: 0.25 | 10 days supply | Qty: 20 | Fill #0

## 2015-09-24 NOTE — Telephone Encounter (Signed)
Called pharm and verified that they do not have a record of the xanax Rf that was faxed on 7/28. Called in RF verbally and notified pt on VM that done and reminded that he needs f/up before runs out.

## 2015-10-29 MED FILL — QVAR 40 MCG ORAL INHALER: 40 | 30 days supply | Qty: 9 | Fill #5

## 2015-10-29 MED FILL — VENTOLIN HFA 90 MCG INHALER: 108 (90 BAS | 30 days supply | Qty: 18 | Fill #4

## 2015-12-03 ENCOUNTER — Other Ambulatory Visit: Payer: Self-pay

## 2015-12-03 DIAGNOSIS — J302 Other seasonal allergic rhinitis: Secondary | ICD-10-CM

## 2015-12-03 NOTE — Telephone Encounter (Signed)
Last seen 05/21/15

## 2015-12-04 MED ORDER — BECLOMETHASONE DIPROPIONATE 40 MCG/ACT IN AERS: 1.0000 | INHALATION_SPRAY | Freq: Two times a day (BID) | RESPIRATORY_TRACT | 1 refills | Status: DC

## 2015-12-04 MED ORDER — ALBUTEROL SULFATE HFA 108 (90 BASE) MCG/ACT IN AERS: 2.0000 | INHALATION_SPRAY | RESPIRATORY_TRACT | 1 refills | Status: DC | PRN

## 2015-12-04 NOTE — Telephone Encounter (Signed)
It appears his asthma was last discussed in October 2016. Medications were refilled, but follow up with me prior to these running out to discuss asthma control.

## 2015-12-08 NOTE — Telephone Encounter (Signed)
L/m with md advice

## 2015-12-25 MED FILL — VENTOLIN HFA 90 MCG INHALER: 108 (90 BAS | 25 days supply | Qty: 18 | Fill #0

## 2015-12-25 MED FILL — QVAR 40 MCG ORAL INHALER: 40 | 60 days supply | Qty: 9 | Fill #0

## 2016-11-24 ENCOUNTER — Encounter: Payer: Self-pay | Admitting: Adult Health

## 2016-11-24 ENCOUNTER — Ambulatory Visit (INDEPENDENT_AMBULATORY_CARE_PROVIDER_SITE_OTHER): Payer: 59 | Admitting: Adult Health

## 2016-11-24 VITALS — BP 126/81 | HR 93 | Ht 76.0 in | Wt 157.2 lb

## 2016-11-24 DIAGNOSIS — S99922A Unspecified injury of left foot, initial encounter: Secondary | ICD-10-CM

## 2016-11-24 DIAGNOSIS — R5383 Other fatigue: Secondary | ICD-10-CM

## 2016-11-24 DIAGNOSIS — Z833 Family history of diabetes mellitus: Secondary | ICD-10-CM

## 2016-11-24 DIAGNOSIS — Z0000 Encounter for general adult medical examination without abnormal findings: Secondary | ICD-10-CM | POA: Diagnosis not present

## 2016-11-24 DIAGNOSIS — J45909 Unspecified asthma, uncomplicated: Secondary | ICD-10-CM | POA: Diagnosis not present

## 2016-11-24 MED ORDER — ALBUTEROL SULFATE HFA 108 (90 BASE) MCG/ACT IN AERS: 2.0000 | INHALATION_SPRAY | RESPIRATORY_TRACT | 1 refills | Status: DC | PRN

## 2016-11-24 NOTE — Patient Instructions (Signed)

## 2016-11-24 NOTE — Progress Notes (Signed)
Subjective:    Patient ID: Anthony Ramos, male    DOB: January 02, 1971, 46 y.o.   MRN: [id number]  HPI:  Anthony Ramos is to establish as a new pt.  He is a very pleasant 46 year old male.  PMH:  GAD and Asthma.  He is not currently taking medication or in therapy for the GAD and declines referral today.  He reports asthma flares in response to seasonal changes or environmental exposures (dust).  He estimates to drink  >70 oz water/day and follows a plant based diet.  He denies tobacco or excessive ETOH use.  He feels that his GAD is situational and r/t to tensions with neighbors (noise and improper use of land). He has not had regular healthcare in >5 years. He reports infrequent use of inhalers.  Patient Care Team    Relationship Specialty Notifications Start End  Julaine Fusi, NP PCP - General Family Medicine  11/24/16     Patient Active Problem List   Diagnosis Date Noted  . Healthcare maintenance 11/24/2016  . Fatigue 11/24/2016  . Asthma 11/24/2016  . Injury of left toe 11/24/2016  . Unspecified asthma(493.90) 05/08/2012     Past Medical History:  Diagnosis Date  . Allergy   . Asthma      Past Surgical History:  Procedure Laterality Date  . VASECTOMY       Family History  Problem Relation Age of Onset  . Heart disease Father   . Alcohol abuse Father   . Diabetes Paternal Grandfather   . Hypertension Paternal Grandfather      History  Drug Use No     History  Alcohol Use  . 4.8 oz/week  . 8 Cans of beer per week     History  Smoking Status  . Never Smoker  Smokeless Tobacco  . Never Used     Outpatient Encounter Prescriptions as of 11/24/2016  Medication Sig  . albuterol (VENTOLIN HFA) 108 (90 Base) MCG/ACT inhaler Inhale 2 puffs into the lungs every 4 (four) hours as needed for wheezing or shortness of breath.  . [DISCONTINUED] albuterol (VENTOLIN HFA) 108 (90 Base) MCG/ACT inhaler Inhale 2 puffs into the lungs every 4 (four) hours as needed  for wheezing or shortness of breath.  . [DISCONTINUED] ALPRAZolam (XANAX) 0.25 MG tablet Take 1 tablet (0.25 mg total) by mouth 2 (two) times daily as needed for anxiety.  . [DISCONTINUED] beclomethasone (QVAR) 40 MCG/ACT inhaler Inhale 1 puff into the lungs 2 (two) times daily.  . [DISCONTINUED] cyclobenzaprine (FLEXERIL) 5 MG tablet 1 pill by mouth up to every 8 hours as needed. Start with one pill by mouth each bedtime as needed due to sedation  . [DISCONTINUED] meloxicam (MOBIC) 7.5 MG tablet Take 1 tablet (7.5 mg total) by mouth daily.  . [DISCONTINUED] sertraline (ZOLOFT) 50 MG tablet Take 1 tablet (50 mg total) by mouth daily.  . [DISCONTINUED] triamcinolone cream (KENALOG) 0.1 % Apply 1 application topically 2 (two) times daily.   No facility-administered encounter medications on file as of 11/24/2016.     Allergies: Penicillins  Body mass index is 19.13 kg/m.  Blood pressure 126/81, pulse 93, height 6\' 4"  (1.93 m), weight 157 lb 3.2 oz (71.3 kg).     Review of Systems  Constitutional: Positive for fatigue. Negative for activity change, appetite change, chills, diaphoresis, fever and unexpected weight change.  HENT: Negative for congestion.   Eyes: Negative for visual disturbance.  Respiratory: Negative for cough, chest tightness, shortness  of breath, wheezing and stridor.   Cardiovascular: Negative for chest pain, palpitations and leg swelling.  Gastrointestinal: Negative for abdominal distention, abdominal pain, blood in stool, constipation, diarrhea, nausea and vomiting.  Endocrine: Negative for cold intolerance, heat intolerance, polydipsia, polyphagia and polyuria.  Genitourinary: Negative for difficulty urinating, flank pain and hematuria.  Musculoskeletal: Positive for arthralgias. Negative for back pain, gait problem, joint swelling, myalgias, neck pain and neck stiffness.       Left 4th toe edema present for >3 weeks  Skin: Negative for color change, pallor, rash and  wound.  Neurological: Negative for dizziness and headaches.  Hematological: Does not bruise/bleed easily.  Psychiatric/Behavioral: Negative for dysphoric mood, hallucinations, self-injury and suicidal ideas. The patient is nervous/anxious. The patient is not hyperactive.        Objective:   Physical Exam  Constitutional: He is oriented to person, place, and time. He appears well-developed and well-nourished. No distress.  HENT:  Head: Normocephalic and atraumatic.  Right Ear: External ear normal.  Left Ear: External ear normal.  Eyes: Pupils are equal, round, and reactive to light. Conjunctivae are normal.  Cardiovascular: Normal rate, regular rhythm, normal heart sounds and intact distal pulses.   No murmur heard. Pulmonary/Chest: Effort normal and breath sounds normal. No respiratory distress. He has no wheezes. He has no rales. He exhibits no tenderness.  Musculoskeletal: He exhibits edema. He exhibits no tenderness.  L 4th toe - 1+ edema, normal ROM, and no excessive heat/warmth noted  Neurological: He is alert and oriented to person, place, and time.  Skin: Skin is warm and dry. No rash noted. He is not diaphoretic. No erythema. No pallor.  Psychiatric: He has a normal mood and affect. His behavior is normal. Judgment and thought content normal.          Assessment & Plan:   1. Healthcare maintenance   2. Family history of diabetes mellitus   3. Fatigue, unspecified type   4. Asthma, unspecified asthma severity, unspecified whether complicated, unspecified whether persistent   5. Injury of toe on left foot, initial encounter     Asthma Denies persistent asthma and is well controlled with PRN Albuterol. Denies tobacco use.  Healthcare maintenance Continue to drink plenty of water and your plant based diet. Refill on Albuterol sent in. Please schedule full physical with fasting labs in the next few months.  Injury of left toe L 4th toe - 1+ edema, normal ROM, and no  excessive heat/warmth noted Ice, elevate, buddy tape toe    FOLLOW-UP:  Return in about 3 months (around 02/24/2017) for CPE, Fasting Lab Draw.

## 2016-11-24 NOTE — Assessment & Plan Note (Signed)
L 4th toe - 1+ edema, normal ROM, and no excessive heat/warmth noted Ice, elevate, buddy tape toe

## 2016-11-24 NOTE — Assessment & Plan Note (Signed)
Denies persistent asthma and is well controlled with PRN Albuterol. Denies tobacco use.

## 2016-11-24 NOTE — Assessment & Plan Note (Signed)
Continue to drink plenty of water and your plant based diet. Refill on Albuterol sent in. Please schedule full physical with fasting labs in the next few months.

## 2017-02-21 NOTE — Progress Notes (Deleted)
Subjective:    Patient ID: Anthony Ramos, male    DOB: 20-Aug-1970, 46 y.o.   MRN: [id number]  HPI:  11/24/16 OV: Anthony Ramos is to establish as a new pt.  He is a very pleasant 47 year old male.  PMH:  GAD and Asthma.  He is not currently taking medication or in therapy for the GAD and declines referral today.  He reports asthma flares in response to seasonal changes or environmental exposures (dust).  He estimates to drink  >70 oz water/day and follows a plant based diet.  He denies tobacco or excessive ETOH use.  He feels that his GAD is situational and r/t to tensions with neighbors (noise and improper use of land). He has not had regular healthcare in >5 years. He reports infrequent use of inhalers.  02/23/17 OV: Anthony Ramos is her for CPE. Healthcare Maintenance: Immunizations-  Patient Care Team    Relationship Specialty Notifications Start End  [place], Jinny Blossom, NP PCP - General Family Medicine  11/24/16     Patient Active Problem List   Diagnosis Date Noted  . Healthcare maintenance 11/24/2016  . Fatigue 11/24/2016  . Asthma 11/24/2016  . Injury of left toe 11/24/2016  . Unspecified asthma(493.90) 05/08/2012     Past Medical History:  Diagnosis Date  . Allergy   . Asthma      Past Surgical History:  Procedure Laterality Date  . VASECTOMY       Family History  Problem Relation Age of Onset  . Heart disease Father   . Alcohol abuse Father   . Diabetes Paternal Grandfather   . Hypertension Paternal Grandfather      Social History   Substance and Sexual Activity  Drug Use No     Social History   Substance and Sexual Activity  Alcohol Use Yes  . Alcohol/week: 4.8 oz  . Types: 8 Cans of beer per week     Social History   Tobacco Use  Smoking Status Never Smoker  Smokeless Tobacco Never Used     Outpatient Encounter Medications as of 02/23/2017  Medication Sig  . albuterol (VENTOLIN HFA) 108 (90 Base) MCG/ACT inhaler Inhale 2 puffs into  the lungs every 4 (four) hours as needed for wheezing or shortness of breath.   No facility-administered encounter medications on file as of 02/23/2017.     Allergies: Penicillins  There is no height or weight on file to calculate BMI.  There were no vitals taken for this visit.     Review of Systems  Constitutional: Positive for fatigue. Negative for activity change, appetite change, chills, diaphoresis, fever and unexpected weight change.  HENT: Negative for congestion.   Eyes: Negative for visual disturbance.  Respiratory: Negative for cough, chest tightness, shortness of breath, wheezing and stridor.   Cardiovascular: Negative for chest pain, palpitations and leg swelling.  Gastrointestinal: Negative for abdominal distention, abdominal pain, blood in stool, constipation, diarrhea, nausea and vomiting.  Endocrine: Negative for cold intolerance, heat intolerance, polydipsia, polyphagia and polyuria.  Genitourinary: Negative for difficulty urinating, flank pain and hematuria.  Musculoskeletal: Positive for arthralgias. Negative for back pain, gait problem, joint swelling, myalgias, neck pain and neck stiffness.       Left 4th toe edema present for >3 weeks  Skin: Negative for color change, pallor, rash and wound.  Neurological: Negative for dizziness and headaches.  Hematological: Does not bruise/bleed easily.  Psychiatric/Behavioral: Negative for dysphoric mood, hallucinations, self-injury and suicidal ideas. The patient is nervous/anxious.  The patient is not hyperactive.        Objective:   Physical Exam  Constitutional: He is oriented to person, place, and time. He appears well-developed and well-nourished. No distress.  HENT:  Head: Normocephalic and atraumatic.  Right Ear: External ear normal.  Left Ear: External ear normal.  Eyes: Conjunctivae are normal. Pupils are equal, round, and reactive to light.  Cardiovascular: Normal rate, regular rhythm, normal heart sounds  and intact distal pulses.  No murmur heard. Pulmonary/Chest: Effort normal and breath sounds normal. No respiratory distress. He has no wheezes. He has no rales. He exhibits no tenderness.  Musculoskeletal: He exhibits edema. He exhibits no tenderness.  L 4th toe - 1+ edema, normal ROM, and no excessive heat/warmth noted  Neurological: He is alert and oriented to person, place, and time.  Skin: Skin is warm and dry. No rash noted. He is not diaphoretic. No erythema. No pallor.  Psychiatric: He has a normal mood and affect. His behavior is normal. Judgment and thought content normal.          Assessment & Plan:   No diagnosis found.  No problem-specific Assessment & Plan notes found for this encounter.    FOLLOW-UP:  No Follow-up on file.

## 2017-02-23 ENCOUNTER — Encounter: Payer: 59 | Admitting: Adult Health

## 2017-05-25 ENCOUNTER — Encounter (HOSPITAL_COMMUNITY): Payer: Self-pay | Admitting: Emergency Medicine

## 2017-05-25 ENCOUNTER — Telehealth: Payer: Self-pay

## 2017-05-25 ENCOUNTER — Emergency Department (HOSPITAL_COMMUNITY)
Admission: EM | Admit: 2017-05-25 | Discharge: 2017-05-25 | Disposition: A | Discharge status: Home / Self Care | Payer: 59 | Attending: Emergency Medicine | Admitting: Emergency Medicine

## 2017-05-25 ENCOUNTER — Emergency Department (HOSPITAL_COMMUNITY): Payer: 59

## 2017-05-25 ENCOUNTER — Other Ambulatory Visit: Payer: Self-pay

## 2017-05-25 DIAGNOSIS — R531 Weakness: Secondary | ICD-10-CM | POA: Insufficient documentation

## 2017-05-25 DIAGNOSIS — E860 Dehydration: Secondary | ICD-10-CM | POA: Insufficient documentation

## 2017-05-25 DIAGNOSIS — J45909 Unspecified asthma, uncomplicated: Secondary | ICD-10-CM | POA: Insufficient documentation

## 2017-05-25 LAB — BASIC METABOLIC PANEL
Anion gap: 9 (ref 5–15)
BUN: 16 mg/dL (ref 6–20)
CO2: 25 mmol/L (ref 22–32)
Calcium: 9.6 mg/dL (ref 8.9–10.3)
Chloride: 104 mmol/L (ref 101–111)
Creatinine, Ser: 0.93 mg/dL (ref 0.61–1.24)
GFR calc Af Amer: >60 mL/min (ref >60)
Glucose, Bld: 88 mg/dL (ref 65–99)
POTASSIUM: 4 mmol/L (ref 3.5–5.1)
SODIUM: 138 mmol/L (ref 135–145)

## 2017-05-25 LAB — CBG MONITORING, ED: GLUCOSE-CAPILLARY: 72 mg/dL (ref 65–99)

## 2017-05-25 LAB — CBC
HCT: 44.1 % (ref 39.0–52.0)
Hemoglobin: 14.9 g/dL (ref 13.0–17.0)
MCH: 32.3 pg (ref 26.0–34.0)
MCHC: 33.8 g/dL (ref 30.0–36.0)
MCV: 95.7 fL (ref 78.0–100.0)
PLATELETS: 285 10*3/uL (ref 150–400)
RBC: 4.61 MIL/uL (ref 4.22–5.81)
RDW: 12.3 % (ref 11.5–15.5)
WBC: 8.2 10*3/uL (ref 4.0–10.5)

## 2017-05-25 LAB — TROPONIN I

## 2017-05-25 MED ORDER — IOPAMIDOL (ISOVUE-370) INJECTION 76%
50.0000 mL | Freq: Once | INTRAVENOUS | Status: AC | PRN
  Administered 2017-05-25: 50 mL via INTRAVENOUS

## 2017-05-25 MED ORDER — LACTATED RINGERS IV BOLUS
1000.0000 mL | Freq: Once | INTRAVENOUS | Status: AC
  Administered 2017-05-25: 1000 mL via INTRAVENOUS

## 2017-05-25 MED ORDER — IOPAMIDOL (ISOVUE-370) INJECTION 76%
INTRAVENOUS | Status: AC
  Filled 2017-05-25: qty 50

## 2017-05-25 NOTE — Telephone Encounter (Signed)
Pt's wife called stating that pt suddenly lost movement of one arm last night and "dropped his glass".  He did not feel well so he went to bed.  This morning he is still unable to move his arm and seems "disoriented".  Pt denies chest pains.  Spouse states that she does not see any facial drooping and is able to stick his tongue out straight.  Advised spouse that she should call EMS and have pt taken to hospital for evaluation since this sounds like it may be a neurological problem such as TIA or CVA.  Spouse expressed understanding and is agreeable.  Tiajuana Amass. Nelson, CMA

## 2017-05-25 NOTE — ED Triage Notes (Signed)
Patient complains of left arm weakness that he first noticed yesterday at 2000. Patient reports that the weakness has improved since last night but persists today. No detectable weakness in arm strength in triage. Patient reports he mainly notices the weakness when he is trying to lower the arm while holding an object, in which case he says his arm is unable to support weight in the left arm. No other neuro deficits noted. 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

## 2017-05-25 NOTE — ED Notes (Signed)
Pt discharged from ED; instructions provided; Pt encouraged to return to ED if symptoms worsen and to f/u with PCP; Pt verbalized understanding of all instructions 

## 2017-05-25 NOTE — ED Provider Notes (Signed)
Emergency Department Provider Note   I have reviewed the triage vital signs and the nursing notes.   HISTORY  Chief Complaint Weakness   HPI Anthony Ramos is a 47 y.o. male without a known past medical history the presents to the emergency department today with left arm weakness.  Patient states that he got home yesterday after a long day working outside and he was very exhausted and dehydrated and he went to pick something up that he would normally have no problem picking up but his arm is weak and grip strength was weak and he could not barely lift it up. Had a heavy feeling in his left arm at that time as well. This is slowly and progressively improved since that time.  Never anything at this before.  Has not done anything for symptoms besides drink water and rest.  States he has had dehydration before to the point of seizures but did not feel that bad this time.  He has no weakness anywhere else.  No sensation changes.  No difficulty walking or speaking. No other associated or modifying symptoms.    Past Medical History:  Diagnosis Date  . Allergy   . Asthma     Patient Active Problem List   Diagnosis Date Noted  . Healthcare maintenance 11/24/2016  . Fatigue 11/24/2016  . Asthma 11/24/2016  . Injury of left toe 11/24/2016  . Unspecified asthma(493.90) 05/08/2012    Past Surgical History:  Procedure Laterality Date  . VASECTOMY      Current Outpatient Rx  . Order #: [id number] Class: Normal    Allergies Penicillins  Family History  Problem Relation Age of Onset  . Heart disease Father   . Alcohol abuse Father   . Diabetes Paternal Grandfather   . Hypertension Paternal Grandfather     Social History Social History   Tobacco Use  . Smoking status: Never Smoker  . Smokeless tobacco: Never Used  Substance Use Topics  . Alcohol use: Yes    Alcohol/week: 4.8 oz    Types: 8 Cans of beer per week  . Drug use: No    Review of Systems  All other systems  negative except as documented in the HPI. All pertinent positives and negatives as reviewed in the HPI. ____________________________________________   PHYSICAL EXAM:  VITAL SIGNS: ED Triage Vitals  Enc Vitals Group     BP 05/25/17 0907 (!) 130/91     Pulse Rate 05/25/17 0907 95     Resp 05/25/17 0907 16     Temp 05/25/17 0907 98.7 F (37.1 C)     Temp Source 05/25/17 0907 Oral     SpO2 05/25/17 0907 100 %    Constitutional: Alert and oriented. Well appearing and in no acute distress. Eyes: Conjunctivae are normal. PERRL. EOMI. Head: Atraumatic. Nose: No congestion/rhinnorhea. Mouth/Throat: Mucous membranes are moist.  Oropharynx non-erythematous. Neck: No stridor.  No meningeal signs.   Cardiovascular: Normal rate, regular rhythm. Good peripheral circulation. Grossly normal heart sounds.   Respiratory: Normal respiratory effort.  No retractions. Lungs CTAB. Gastrointestinal: Soft and nontender. No distention.  Musculoskeletal: No lower extremity tenderness nor edema. No gross deformities of extremities. Neurologic:  Normal speech and language. No gross focal neurologic deficits are appreciated. No altered mental status, able to give full seemingly accurate history.  Face is symmetric, EOM's intact, pupils equal and reactive, vision intact, tongue and uvula midline without deviation. Upper and Lower extremity motor 5/5, intact pain perception in distal extremities, 2+ reflexes  in biceps, patella and achilles tendons. Able to perform finger to nose normal with both hands. Walks without assistance or evident ataxia.  Skin:  Skin is warm, dry and intact. No rash noted.  ____________________________________________   LABS (all labs ordered are listed, but only abnormal results are displayed)  Labs Reviewed  BASIC METABOLIC PANEL  CBC  TROPONIN I  URINALYSIS, ROUTINE W REFLEX MICROSCOPIC  CBG MONITORING, ED   ____________________________________________  EKG   EKG  Interpretation  Date/Time:    Ventricular Rate:    PR Interval:    QRS Duration:   QT Interval:    QTC Calculation:   R Axis:     Text Interpretation:         ____________________________________________  RADIOLOGY  Ct Angio Head W Or Wo Contrast  Result Date: 05/25/2017 CLINICAL DATA:  Left arm weakness.  Suspect subarachnoid hemorrhage EXAM: CT ANGIOGRAPHY HEAD AND NECK TECHNIQUE: Multidetector CT imaging of the head and neck was performed using the standard protocol during bolus administration of intravenous contrast. Multiplanar CT image reconstructions and MIPs were obtained to evaluate the vascular anatomy. Carotid stenosis measurements (when applicable) are obtained utilizing NASCET criteria, using the distal internal carotid diameter as the denominator. CONTRAST:  50mL ISOVUE-370 IOPAMIDOL (ISOVUE-370) INJECTION 76% COMPARISON:  None. FINDINGS: CT HEAD FINDINGS Brain: Ventricle size normal. Negative for infarct, hemorrhage, mass Vascular: Negative for hyperdense vessel Skull: Negative Sinuses: Negative Orbits: Negative Review of the MIP images confirms the above findings CTA NECK FINDINGS Aortic arch: Normal aortic arch and proximal great vessels. Right carotid system: Normal right carotid system. Negative for stenosis atherosclerotic disease or dissection Left carotid system: Normal left carotid system. Negative for stenosis, dissection, or atherosclerotic disease Vertebral arteries: Normal Skeleton: Negative Other neck: Negative Upper chest: Negative Review of the MIP images confirms the above findings CTA HEAD FINDINGS Anterior circulation: Cavernous carotid widely patent. Anterior middle cerebral arteries patent bilaterally without stenosis. Posterior circulation: Both vertebral arteries patent to the basilar. Basilar widely patent. Superior cerebellar and posterior cerebral arteries widely patent bilaterally. Suboptimal intracranial vascular evaluation due to timing of the contrast  bolus. Venous sinuses: Patent Anatomic variants: None Delayed phase: Normal enhancement on delayed imaging Review of the MIP images confirms the above findings IMPRESSION: Negative CTA head and  neck. Negative CT head Electronically Signed   By: Marlan Palau M.D.   On: 05/25/2017 17:35   Ct Angio Neck W And/or Wo Contrast  Result Date: 05/25/2017 CLINICAL DATA:  Left arm weakness.  Suspect subarachnoid hemorrhage EXAM: CT ANGIOGRAPHY HEAD AND NECK TECHNIQUE: Multidetector CT imaging of the head and neck was performed using the standard protocol during bolus administration of intravenous contrast. Multiplanar CT image reconstructions and MIPs were obtained to evaluate the vascular anatomy. Carotid stenosis measurements (when applicable) are obtained utilizing NASCET criteria, using the distal internal carotid diameter as the denominator. CONTRAST:  50mL ISOVUE-370 IOPAMIDOL (ISOVUE-370) INJECTION 76% COMPARISON:  None. FINDINGS: CT HEAD FINDINGS Brain: Ventricle size normal. Negative for infarct, hemorrhage, mass Vascular: Negative for hyperdense vessel Skull: Negative Sinuses: Negative Orbits: Negative Review of the MIP images confirms the above findings CTA NECK FINDINGS Aortic arch: Normal aortic arch and proximal great vessels. Right carotid system: Normal right carotid system. Negative for stenosis atherosclerotic disease or dissection Left carotid system: Normal left carotid system. Negative for stenosis, dissection, or atherosclerotic disease Vertebral arteries: Normal Skeleton: Negative Other neck: Negative Upper chest: Negative Review of the MIP images confirms the above findings CTA HEAD FINDINGS Anterior  circulation: Cavernous carotid widely patent. Anterior middle cerebral arteries patent bilaterally without stenosis. Posterior circulation: Both vertebral arteries patent to the basilar. Basilar widely patent. Superior cerebellar and posterior cerebral arteries widely patent bilaterally. Suboptimal  intracranial vascular evaluation due to timing of the contrast bolus. Venous sinuses: Patent Anatomic variants: None Delayed phase: Normal enhancement on delayed imaging Review of the MIP images confirms the above findings IMPRESSION: Negative CTA head and  neck. Negative CT head Electronically Signed   By: Marlan Palau M.D.   On: 05/25/2017 17:35    ____________________________________________   PROCEDURES  Procedure(s) performed:   Procedures   ____________________________________________   INITIAL IMPRESSION / ASSESSMENT AND PLAN / ED COURSE  Concern for possible TIA vs dehydration vs cardiac cause for symptoms. Will evaluate for same. cta head/neck, ecg/troponin, cbc, bmp, bolus.  Workup negative. Neuro exam still intact. Possible TIA but seems unlikely without many risk factors. Peripheral nerve issue as he did hit it the day before but that also seems unlikely with the time course of the presentation. Plan for dc w/ pcp follow up.   Pertinent labs & imaging results that were available during my care of the patient were reviewed by me and considered in my medical decision making (see chart for details).  ____________________________________________  FINAL CLINICAL IMPRESSION(S) / ED DIAGNOSES  Final diagnoses:  Weakness  Dehydration     MEDICATIONS GIVEN DURING THIS VISIT:  Medications  lactated ringers bolus 1,000 mL (0 mLs Intravenous Stopped 05/25/17 1728)  iopamidol (ISOVUE-370) 76 % injection 50 mL (50 mLs Intravenous Contrast Given 05/25/17 1614)     NEW OUTPATIENT MEDICATIONS STARTED DURING THIS VISIT:  New Prescriptions   No medications on file    Note:  This note was prepared with assistance of Dragon voice recognition software. Occasional wrong-word or sound-a-like substitutions may have occurred due to the inherent limitations of voice recognition software.   Marily Memos, MD 05/25/17 1810

## 2017-10-13 MED FILL — VENTOLIN HFA 90 MCG INHALER: 108 (90 BAS | 17 days supply | Qty: 18 | Fill #0

## 2018-04-05 DIAGNOSIS — H5213 Myopia, bilateral: Secondary | ICD-10-CM | POA: Diagnosis not present

## 2018-04-10 ENCOUNTER — Other Ambulatory Visit: Payer: Self-pay | Admitting: Adult Health

## 2018-04-12 ENCOUNTER — Other Ambulatory Visit: Payer: Self-pay | Admitting: Adult Health

## 2018-04-12 ENCOUNTER — Telehealth: Payer: Self-pay | Admitting: Adult Health

## 2018-04-12 MED FILL — VENTOLIN HFA 90 MCG INHALER: 108 (90 BAS | 17 days supply | Qty: 18 | Fill #0

## 2018-04-12 NOTE — Telephone Encounter (Signed)
Patient called requested refill on  :   albuterol (VENTOLIN HFA) 108 (90 Base) MCG/ACT inhaler [[id number]]   Order Details  Dose: 2 puff Route: Inhalation Frequency: Every 4 hours PRN for wheezing, shortness of breath  Dispense Quantity: 18 g Refills: 1 Fills remaining: --        Sig: Inhale 2 puffs into the lungs every 4 (four) hours as needed for wheezing or shortness of breath.     ----Forwarding request to medical assistant for refill approval & if approved send order to :   [facility] - [place], [place] - [address] [place]. [phone] (Phone) [phone] (Fax)   --Fausto Skillern

## 2018-10-16 ENCOUNTER — Other Ambulatory Visit: Payer: Self-pay

## 2018-10-16 ENCOUNTER — Ambulatory Visit (INDEPENDENT_AMBULATORY_CARE_PROVIDER_SITE_OTHER): Payer: 59 | Admitting: Adult Health

## 2018-10-16 ENCOUNTER — Encounter: Payer: Self-pay | Admitting: Adult Health

## 2018-10-16 DIAGNOSIS — Z0000 Encounter for general adult medical examination without abnormal findings: Secondary | ICD-10-CM | POA: Diagnosis not present

## 2018-10-16 MED ORDER — ALBUTEROL SULFATE HFA 108 (90 BASE) MCG/ACT IN AERS: 2.0000 | INHALATION_SPRAY | RESPIRATORY_TRACT | 1 refills | Status: DC | PRN

## 2018-10-16 MED FILL — ALBUTEROL SULFATE HFA 108 (: 108 (90 BAS | 16 days supply | Qty: 18 | Fill #0

## 2018-10-16 NOTE — Progress Notes (Signed)
Virtual Visit via Telephone Note  I connected with Anthony Ramos on 10/16/18 at  1:45 PM EDT by telephone and verified that I am speaking with the correct person using two identifiers.  Location: Patient: Home Provider: In Clinic   I discussed the limitations, risks, security and privacy concerns of performing an evaluation and management service by telephone and the availability of in person appointments. I also discussed with the patient that there may be a patient responsible charge related to this service. The patient expressed understanding and agreed to proceed.   History of Present Illness: Mr. Anthony Ramos calls in for refill on albuterol inhaler. He estimates to use inhaler 2-3 times/week. He has known seasonal and mold allergies. He denies tobacco/vape use He follows Vegan diet and lives a very healthy lifestyle He has not followed up since establishing with practice 10/2016  Patient Care Team    Relationship Specialty Notifications Start End  Esaw Grandchild, NP PCP - General Family Medicine  11/24/16     Patient Active Problem List   Diagnosis Date Noted  . Healthcare maintenance 11/24/2016  . Fatigue 11/24/2016  . Asthma 11/24/2016  . Injury of left toe 11/24/2016  . Unspecified asthma(493.90) 05/08/2012     Past Medical History:  Diagnosis Date  . Allergy   . Asthma      Past Surgical History:  Procedure Laterality Date  . VASECTOMY       Family History  Problem Relation Age of Onset  . Heart disease Father   . Alcohol abuse Father   . Diabetes Paternal Grandfather   . Hypertension Paternal Grandfather      Social History   Substance and Sexual Activity  Drug Use No     Social History   Substance and Sexual Activity  Alcohol Use Yes  . Alcohol/week: 8.0 standard drinks  . Types: 8 Cans of beer per week     Social History   Tobacco Use  Smoking Status Never Smoker  Smokeless Tobacco Never Used     Outpatient Encounter  Medications as of 10/16/2018  Medication Sig  . albuterol (VENTOLIN HFA) 108 (90 Base) MCG/ACT inhaler Inhale 2 puffs into the lungs every 4 (four) hours as needed for wheezing or shortness of breath.  . [DISCONTINUED] albuterol (VENTOLIN HFA) 108 (90 Base) MCG/ACT inhaler Inhale 2 puffs into the lungs every 4 (four) hours as needed for wheezing or shortness of breath. NO FURTHER REFILLS UNTIL PATIENT IS SEEN   No facility-administered encounter medications on file as of 10/16/2018.     Allergies: Penicillins  Body mass index is 21.3 kg/m.  Temperature 98 F (36.7 C), temperature source Oral, weight 175 lb (79.4 kg). Review of Systems: General:   Denies fever, chills, unexplained weight loss.  Optho/Auditory:   Denies visual changes, blurred vision/LOV Respiratory:   Denies SOB, DOE more than baseline levels.  Cardiovascular:   Denies chest pain, palpitations, new onset peripheral edema  Gastrointestinal:   Denies nausea, vomiting, diarrhea.  Genitourinary: Denies dysuria, freq/ urgency, flank pain or discharge from genitals.  Endocrine:     Denies hot or cold intolerance, polyuria, polydipsia. Musculoskeletal:   Denies unexplained myalgias, joint swelling, unexplained arthralgias, gait problems.  Skin:  Denies rash, suspicious lesions Neurological:     Denies dizziness, unexplained weakness, numbness  Psychiatric/Behavioral:   Denies mood changes, suicidal or homicidal ideations, hallucinations This patient does not have sx concerning for COVID-19 Infection (ie; fever, chills, cough, new or worsening shortness of breath).  Observations/Objective: No acute distress noted during the telephone conversation  Assessment and Plan: Refill on albuterol inhaler Schedule CPE in 6 weeks, fasting labs week prior Continue Vegan diet, remain as active as possible Continue to social distance, wear a mask in public  Follow Up Instructions: Schedule CPE in 6 weeks, fasting labs week prior    I discussed the assessment and treatment plan with the patient. The patient was provided an opportunity to ask questions and all were answered. The patient agreed with the plan and demonstrated an understanding of the instructions.   The patient was advised to call back or seek an in-person evaluation if the symptoms worsen or if the condition fails to improve as anticipated.  I provided 8 minutes of non-face-to-face time during this encounter.   Julaine FusiKaty D , NP

## 2018-10-16 NOTE — Assessment & Plan Note (Signed)
Assessment and Plan: Refill on albuterol inhaler Schedule CPE in 6 weeks, fasting labs week prior Continue Vegan diet, remain as active as possible Continue to social distance, wear a mask in public  Follow Up Instructions: Schedule CPE in 6 weeks, fasting labs week prior   I discussed the assessment and treatment plan with the patient. The patient was provided an opportunity to ask questions and all were answered. The patient agreed with the plan and demonstrated an understanding of the instructions.   The patient was advised to call back or seek an in-person evaluation if the symptoms worsen or if the condition fails to improve as anticipated.

## 2019-04-15 MED FILL — ALBUTEROL SULFATE HFA 108 (: 108 (90 BAS | 16 days supply | Qty: 18 | Fill #1

## 2019-05-16 ENCOUNTER — Ambulatory Visit: Payer: 59 | Attending: Family Medicine

## 2019-05-16 DIAGNOSIS — Z23 Encounter for immunization: Secondary | ICD-10-CM

## 2019-05-16 NOTE — Progress Notes (Signed)
   Covid-19 Vaccination Clinic  Name:  Masaru Chamberlin    MRN: [id number] DOB: Jan 19, 1971  05/16/2019  Mr. Wellbrock was observed post Covid-19 immunization for 15 minutes without incident. He was provided with Vaccine Information Sheet and instruction to access the V-Safe system.   Mr. Solem was instructed to call 911 with any severe reactions post vaccine: Marland Kitchen Difficulty breathing  . Swelling of face and throat  . A fast heartbeat  . A bad rash all over body  . Dizziness and weakness   Immunizations Administered    Name Date Dose VIS Date Route   Pfizer COVID-19 Vaccine 05/16/2019 11:34 AM 0.3 mL 01/11/2019 Intramuscular   Manufacturer: [employer], [employer]   Lot: [id number]   NDC: [address]-7

## 2019-06-10 ENCOUNTER — Ambulatory Visit: Payer: 59 | Attending: Internal Medicine

## 2019-06-10 DIAGNOSIS — Z23 Encounter for immunization: Secondary | ICD-10-CM

## 2019-06-10 NOTE — Progress Notes (Signed)
   Covid-19 Vaccination Clinic  Name:  Anthony Ramos    MRN: [id number] DOB: [birth year]  06/10/2019  Anthony Ramos was observed post Covid-19 immunization for 15 minutes without incident. He was provided with Vaccine Information Sheet and instruction to access the V-Safe system.   Anthony Ramos was instructed to call 911 with any severe reactions post vaccine: Marland Kitchen Difficulty breathing  . Swelling of face and throat  . A fast heartbeat  . A bad rash all over body  . Dizziness and weakness   Immunizations Administered    Name Date Dose VIS Date Route   Pfizer COVID-19 Vaccine 06/10/2019 11:24 AM 0.3 mL 03/27/2018 Intramuscular   Manufacturer: [employer], [employer]   Lot: [id number]   NDC: [address]-1

## 2019-10-28 ENCOUNTER — Other Ambulatory Visit: Payer: Self-pay | Admitting: Adult Health

## 2019-10-29 ENCOUNTER — Telehealth: Payer: Self-pay | Admitting: Physician Assistant

## 2019-10-29 MED ORDER — ALBUTEROL SULFATE HFA 108 (90 BASE) MCG/ACT IN AERS: 2.0000 | INHALATION_SPRAY | RESPIRATORY_TRACT | 0 refills | Status: DC | PRN

## 2019-10-29 MED FILL — ALBUTEROL SULFATE HFA 108 (: 108 (90 BAS | 25 days supply | Qty: 18 | Fill #0

## 2019-10-29 NOTE — Telephone Encounter (Signed)
Spoke with pt and advised that he has not been seen in over a year.  Advised pt that one refill sent to pharmacy for albuterol but that he needed f/u prior to any further refills.  Pt given appt for 11/27/2019 to establish with our new provider.  Pt advised that he will schedule CPE at later time.  Tiajuana Amass, CMA

## 2019-10-29 NOTE — Telephone Encounter (Signed)
Patient is requesting a refill of his albuterol inhaler, if approved please send to [facility]

## 2019-10-29 NOTE — Addendum Note (Signed)
Addended by: Stan Head on: 10/29/2019 10:04 AM   Modules accepted: Orders

## 2019-11-27 ENCOUNTER — Ambulatory Visit: Payer: 59 | Admitting: Physician Assistant

## 2019-11-27 ENCOUNTER — Other Ambulatory Visit: Payer: Self-pay

## 2019-11-27 ENCOUNTER — Other Ambulatory Visit: Payer: Self-pay | Admitting: Physician Assistant

## 2019-11-27 ENCOUNTER — Encounter: Payer: Self-pay | Admitting: Physician Assistant

## 2019-11-27 VITALS — BP 131/84 | HR 83 | Temp 98.0°F | Ht 70.0 in | Wt 159.4 lb

## 2019-11-27 DIAGNOSIS — J45909 Unspecified asthma, uncomplicated: Secondary | ICD-10-CM

## 2019-11-27 DIAGNOSIS — J3089 Other allergic rhinitis: Secondary | ICD-10-CM

## 2019-11-27 DIAGNOSIS — Z1211 Encounter for screening for malignant neoplasm of colon: Secondary | ICD-10-CM

## 2019-11-27 MED ORDER — ALBUTEROL SULFATE HFA 108 (90 BASE) MCG/ACT IN AERS: 2.0000 | INHALATION_SPRAY | RESPIRATORY_TRACT | 0 refills | Status: DC | PRN

## 2019-11-27 MED FILL — ALBUTEROL SULFATE HFA 108 (: 108 (90 BAS | 17 days supply | Qty: 18 | Fill #0

## 2019-11-27 NOTE — Progress Notes (Signed)
Established Patient Office Visit  Subjective:  Patient ID: Anthony Ramos, male    DOB: 03/01/1970  Age: 49 y.o. MRN: [id number]  CC:  Chief Complaint  Patient presents with  . Follow-up    HPI Anthony Ramos presents for follow-up on asthma and seasonal allergies.  Patient reports asthma is under good control, has not needed to use albuterol frequently.  Wearing a mask has also helped with seasonal allergies. Patient is inquiring about Cologuard for colon cancer screening.  Denies family history of colon cancer, hematochezia, melena or unintentional weight loss.  Past Medical History:  Diagnosis Date  . Allergy   . Asthma     Past Surgical History:  Procedure Laterality Date  . VASECTOMY      Family History  Problem Relation Age of Onset  . Heart disease Father   . Alcohol abuse Father   . Diabetes Paternal Grandfather   . Hypertension Paternal Grandfather     Social History   Socioeconomic History  . Marital status: Married    Spouse name: Not on file  . Number of children: Not on file  . Years of education: Not on file  . Highest education level: Not on file  Occupational History  . Not on file  Tobacco Use  . Smoking status: Never Smoker  . Smokeless tobacco: Never Used  Vaping Use  . Vaping Use: Never used  Substance and Sexual Activity  . Alcohol use: Yes    Alcohol/week: 8.0 standard drinks    Types: 8 Cans of beer per week  . Drug use: No  . Sexual activity: Yes    Birth control/protection: Surgical  Other Topics Concern  . Not on file  Social History Narrative  . Not on file   Social Determinants of Health   Financial Resource Strain:   . Difficulty of Paying Living Expenses: Not on file  Food Insecurity:   . Worried About Programme researcher, broadcasting/film/video in the Last Year: Not on file  . Ran Out of Food in the Last Year: Not on file  Transportation Needs:   . Lack of Transportation (Medical): Not on file  . Lack of Transportation (Non-Medical):  Not on file  Physical Activity:   . Days of Exercise per Week: Not on file  . Minutes of Exercise per Session: Not on file  Stress:   . Feeling of Stress : Not on file  Social Connections:   . Frequency of Communication with Friends and Family: Not on file  . Frequency of Social Gatherings with Friends and Family: Not on file  . Attends Religious Services: Not on file  . Active Member of Clubs or Organizations: Not on file  . Attends Banker Meetings: Not on file  . Marital Status: Not on file  Intimate Partner Violence:   . Fear of Current or Ex-Partner: Not on file  . Emotionally Abused: Not on file  . Physically Abused: Not on file  . Sexually Abused: Not on file    Outpatient Medications Prior to Visit  Medication Sig Dispense Refill  . albuterol (VENTOLIN HFA) 108 (90 Base) MCG/ACT inhaler Inhale 2 puffs into the lungs every 4 (four) hours as needed for wheezing or shortness of breath. 18 g 0   No facility-administered medications prior to visit.    Allergies  Allergen Reactions  . Penicillins Anaphylaxis    ROS Review of Systems A fourteen system review of systems was performed and found to be positive  as per HPI.   Objective:    Physical Exam General:  Well Developed, well nourished, appropriate for stated age.  Neuro:  Alert and oriented,  extra-ocular muscles intact  HEENT:  Normocephalic, atraumatic, neck supple Skin:  no gross rash, warm, pink. Cardiac:  RRR, S1 S2 Respiratory:  ECTA B/L without wheezing, Not using accessory muscles, speaking in full sentences- unlabored. Vascular:  Ext warm, no cyanosis apprec.; cap RF less 2 sec. no gross edema Psych:  No HI/SI, judgement and insight good, Euthymic mood. Full Affect.   BP 131/84   Pulse 83   Temp 98 F (36.7 C) (Oral)   Ht 5\' 10"  (1.778 m)   Wt 159 lb 6.4 oz (72.3 kg)   SpO2 100% Comment: on RA  BMI 22.87 kg/m  Wt Readings from Last 3 Encounters:  11/27/19 159 lb 6.4 oz (72.3 kg)   10/16/18 175 lb (79.4 kg)  11/24/16 157 lb 3.2 oz (71.3 kg)     Health Maintenance Due  Topic Date Due  . Hepatitis C Screening  Never done  . HIV Screening  Never done  . TETANUS/TDAP  Never done  . COLONOSCOPY  Never done  . INFLUENZA VACCINE  Never done    There are no preventive care reminders to display for this patient.  No results found for: TSH Lab Results  Component Value Date   WBC 8.2 05/25/2017   HGB 14.9 05/25/2017   HCT 44.1 05/25/2017   MCV 95.7 05/25/2017   PLT 285 05/25/2017   Lab Results  Component Value Date   NA 138 05/25/2017   K 4.0 05/25/2017   CO2 25 05/25/2017   GLUCOSE 88 05/25/2017   BUN 16 05/25/2017   CREATININE 0.93 05/25/2017   CALCIUM 9.6 05/25/2017   ANIONGAP 9 05/25/2017   No results found for: CHOL No results found for: HDL No results found for: LDLCALC No results found for: TRIG No results found for: CHOLHDL No results found for: 05/27/2017    Assessment & Plan:   Problem List Items Addressed This Visit      Respiratory   Asthma - Primary   Relevant Medications   albuterol (VENTOLIN HFA) 108 (90 Base) MCG/ACT inhaler    Other Visit Diagnoses    Screening for colon cancer       Relevant Orders   Cologuard   Environmental and seasonal allergies         Asthma, Environmental seasonal allergies: -Stable -Continue current medication regimen.  Provided refill. -Continue to avoid/reduce allergens.   Screening for colon cancer: -Discussed with patient risk versus benefits and no family history of first-degree relative with colon cancer so reasonable to do Cologuard. -Placed order for Cologuard. -Advised to schedule CPE with FBW for healthcare maintenance.  Meds ordered this encounter  Medications  . albuterol (VENTOLIN HFA) 108 (90 Base) MCG/ACT inhaler    Sig: Inhale 2 puffs into the lungs every 4 (four) hours as needed for wheezing or shortness of breath.    Dispense:  18 g    Refill:  0    Order Specific  Question:   Supervising Provider    Answer:   YIFO2D D [id number]]    Follow-up: Return for CPE and FBW next available .   Note:  This note was prepared with assistance of Dragon voice recognition software. Occasional wrong-word or sound-a-like substitutions may have occurred due to the inherent limitations of voice recognition software.  Nani Gasser, PA-C

## 2020-01-17 ENCOUNTER — Ambulatory Visit: Payer: 59 | Attending: Internal Medicine

## 2020-01-17 DIAGNOSIS — Z23 Encounter for immunization: Secondary | ICD-10-CM

## 2020-01-17 NOTE — Progress Notes (Signed)
   Covid-19 Vaccination Clinic  Name:  Anthony Ramos    MRN: [id number] DOB: 07-Jun-1970  01/17/2020  Anthony Ramos was observed post Covid-19 immunization for 15 minutes without incident. He was provided with Vaccine Information Sheet and instruction to access the V-Safe system.   Anthony Ramos was instructed to call 911 with any severe reactions post vaccine: Marland Kitchen Difficulty breathing  . Swelling of face and throat  . A fast heartbeat  . A bad rash all over body  . Dizziness and weakness   Immunizations Administered    Name Date Dose VIS Date Route   Pfizer COVID-19 Vaccine 01/17/2020  2:10 PM 0.3 mL 11/20/2019 Intramuscular   Manufacturer: [employer], [employer]   Lot: [id number]   NDC: [address]-2

## 2020-02-03 ENCOUNTER — Other Ambulatory Visit: Payer: Self-pay | Admitting: Physician Assistant

## 2020-02-03 DIAGNOSIS — Z0000 Encounter for general adult medical examination without abnormal findings: Secondary | ICD-10-CM

## 2020-02-05 ENCOUNTER — Other Ambulatory Visit: Payer: 59

## 2020-02-07 ENCOUNTER — Encounter: Payer: 59 | Admitting: Physician Assistant

## 2020-03-12 ENCOUNTER — Other Ambulatory Visit: Payer: Self-pay | Admitting: Physician Assistant

## 2020-03-12 DIAGNOSIS — Z0000 Encounter for general adult medical examination without abnormal findings: Secondary | ICD-10-CM

## 2020-03-17 ENCOUNTER — Other Ambulatory Visit: Payer: 59

## 2020-03-20 ENCOUNTER — Encounter: Payer: 59 | Admitting: Physician Assistant

## 2020-05-13 ENCOUNTER — Ambulatory Visit (INDEPENDENT_AMBULATORY_CARE_PROVIDER_SITE_OTHER): Payer: 59

## 2020-05-13 ENCOUNTER — Ambulatory Visit (INDEPENDENT_AMBULATORY_CARE_PROVIDER_SITE_OTHER): Payer: 59 | Admitting: Podiatry

## 2020-05-13 ENCOUNTER — Other Ambulatory Visit: Payer: Self-pay

## 2020-05-13 ENCOUNTER — Other Ambulatory Visit: Payer: Self-pay | Admitting: Podiatry

## 2020-05-13 DIAGNOSIS — M21621 Bunionette of right foot: Secondary | ICD-10-CM

## 2020-05-13 DIAGNOSIS — M21622 Bunionette of left foot: Secondary | ICD-10-CM

## 2020-05-13 DIAGNOSIS — M21619 Bunion of unspecified foot: Secondary | ICD-10-CM

## 2020-05-13 NOTE — Progress Notes (Signed)
   HPI: 50 y.o. male presenting today as a new patient for evaluation of bumps to the outsides of his bilateral feet right greater than the left.  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  He states that over the past year his lumps on the outside of his feet have become increasingly prominent.  He presents for further treatment evaluation  Past Medical History:  Diagnosis Date  . Allergy   . Asthma      Physical Exam: General: The patient is alert and oriented x3 in no acute distress.  Dermatology: Skin is warm, dry and supple bilateral lower extremities. Negative for open lesions or macerations.  Vascular: Palpable pedal pulses bilaterally. No edema or erythema noted. Capillary refill within normal limits.  Neurological: Epicritic and protective threshold grossly intact bilaterally.   Musculoskeletal Exam: Range of motion within normal limits to all pedal and ankle joints bilateral. Muscle strength 5/5 in all groups bilateral. Clinical evidence of tailor's bunion deformity noted to the bilateral feet right greater than the left  Radiographic Exam:  Normal osseous mineralization. Joint spaces preserved. No fracture/dislocation/boney destruction.  Increased intermetatarsal space between the fourth and fifth metatarsals with lateral deviation of the fifth metatarsal head noted bilateral right greater than the left.  The right foot almost demonstrates a subluxation of the MTP joint  Assessment: 1.  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 Patient evaluated. X-Rays reviewed.  2.  Today we discussed conservative and surgical treatment options.  Currently the patient is not in a position to have surgery.  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  Recommend compression socks daily 3.  Recommend good shoe gear that does not irritate or aggravate the tailor's bunion  deformities 4.  Return to clinic as needed, or when he is ready for surgical intervention      Felecia Shelling, DPM [facility]  Dr. Felecia Shelling, DPM    [address] [place], [place] [address]                Office [phone]  Fax [phone]

## 2020-08-13 ENCOUNTER — Other Ambulatory Visit (HOSPITAL_COMMUNITY): Payer: Self-pay

## 2020-08-13 ENCOUNTER — Other Ambulatory Visit: Payer: Self-pay | Admitting: Physician Assistant

## 2020-08-13 ENCOUNTER — Telehealth: Payer: Self-pay | Admitting: Physician Assistant

## 2020-08-13 DIAGNOSIS — J45909 Unspecified asthma, uncomplicated: Secondary | ICD-10-CM

## 2020-08-13 MED ORDER — ALBUTEROL SULFATE HFA 108 (90 BASE) MCG/ACT IN AERS
2.0000 | INHALATION_SPRAY | RESPIRATORY_TRACT | 0 refills | Status: DC | PRN
  Filled 2020-08-13: qty 18, 17d supply, fill #0

## 2020-08-13 NOTE — Telephone Encounter (Signed)
Patient would like a refill on his Albuterol and uses [facility] pharmacy, thanks.

## 2020-08-13 NOTE — Addendum Note (Signed)
Addended by: Sylvester Harder on: 08/13/2020 04:59 PM   Modules accepted: Orders

## 2020-08-13 NOTE — Telephone Encounter (Signed)
Please contact patient to schedule per last AVS for further med refills. AS, CMA 

## 2020-08-14 ENCOUNTER — Other Ambulatory Visit (HOSPITAL_COMMUNITY): Payer: Self-pay

## 2020-10-27 ENCOUNTER — Other Ambulatory Visit (HOSPITAL_COMMUNITY): Payer: Self-pay

## 2020-10-27 MED ORDER — AZITHROMYCIN 250 MG PO TABS
ORAL_TABLET | ORAL | 0 refills | Status: DC
  Filled 2020-10-27: qty 6, 5d supply, fill #0

## 2020-11-18 ENCOUNTER — Other Ambulatory Visit: Payer: Self-pay

## 2020-11-18 ENCOUNTER — Ambulatory Visit
Admission: EM | Admit: 2020-11-18 | Discharge: 2020-11-18 | Disposition: A | Discharge status: Home / Self Care | Payer: 59 | Attending: Physician Assistant | Admitting: Physician Assistant

## 2020-11-18 ENCOUNTER — Ambulatory Visit (INDEPENDENT_AMBULATORY_CARE_PROVIDER_SITE_OTHER): Payer: 59

## 2020-11-18 DIAGNOSIS — R0781 Pleurodynia: Secondary | ICD-10-CM | POA: Diagnosis not present

## 2020-11-18 DIAGNOSIS — S299XXA Unspecified injury of thorax, initial encounter: Secondary | ICD-10-CM | POA: Diagnosis not present

## 2020-11-18 DIAGNOSIS — R0782 Intercostal pain: Secondary | ICD-10-CM | POA: Diagnosis not present

## 2020-11-18 NOTE — ED Provider Notes (Signed)
[facility]    CSN: [id number] Arrival date & time: 11/18/20  1134      History   Chief Complaint Chief Complaint  Patient presents with   Rib Injury    HPI Anthony Ramos is a 50 y.o. male.   Patient here today for evaluation of left lateral rib pain that started after he heard a "pop" when he was lying on the ground trying to use a shovel to lift a dead cat from a hole.  He states he has had some pain with movement and deep breathing since that time.  He has been using ibuprofen with some relief.  He just wanted to rule out fracture.  The history is provided by the patient.   Past Medical History:  Diagnosis Date   Allergy    Asthma     Patient Active Problem List   Diagnosis Date Noted   Healthcare maintenance 11/24/2016   Fatigue 11/24/2016   Asthma 11/24/2016   Injury of left toe 11/24/2016   Unspecified asthma(493.90) 05/08/2012    Past Surgical History:  Procedure Laterality Date   VASECTOMY         Home Medications    Prior to Admission medications   Medication Sig Start Date End Date Taking? Authorizing Provider  albuterol (VENTOLIN HFA) 108 (90 Base) MCG/ACT inhaler Inhale 2 puffs into the lungs every 4 (four) hours as needed for wheezing or shortness of breath. 08/13/20 08/13/21  Mayer Masker, PA-C    Family History Family History  Problem Relation Age of Onset   Heart disease Father    Alcohol abuse Father    Diabetes Paternal Grandfather    Hypertension Paternal Grandfather     Social History Social History   Tobacco Use   Smoking status: Never   Smokeless tobacco: Never  Vaping Use   Vaping Use: Never used  Substance Use Topics   Alcohol use: Yes    Alcohol/week: 8.0 standard drinks    Types: 8 Cans of beer per week   Drug use: No     Allergies   Penicillins   Review of Systems Review of Systems  Constitutional:  Negative for chills and fever.  HENT:  Negative for congestion.   Eyes:  Negative for  discharge and redness.  Respiratory:  Negative for cough and shortness of breath.   Cardiovascular:  Positive for chest pain.    Physical Exam Triage Vital Signs ED Triage Vitals  Enc Vitals Group     BP      Pulse      Resp      Temp      Temp src      SpO2      Weight      Height      Head Circumference      Peak Flow      Pain Score      Pain Loc      Pain Edu?      Excl. in GC?    No data found.  Updated Vital Signs BP (!) 144/95 (BP Location: Left Arm)   Pulse 87   Temp 98.1 F (36.7 C) (Oral)   Resp 18   SpO2 98%   Physical Exam Vitals and nursing note reviewed.  Constitutional:      General: He is not in acute distress.    Appearance: Normal appearance. He is not ill-appearing.  HENT:     Head: Normocephalic and atraumatic.  Eyes:  Conjunctiva/sclera: Conjunctivae normal.  Cardiovascular:     Rate and Rhythm: Normal rate and regular rhythm.     Heart sounds: Normal heart sounds. No murmur heard. Pulmonary:     Effort: Pulmonary effort is normal. No respiratory distress.     Breath sounds: Normal breath sounds. No wheezing, rhonchi or rales.  Chest:     Chest wall: Tenderness (mild TTP to left lateral lower ribs) present.  Skin:    General: Skin is warm and dry.  Neurological:     Mental Status: He is alert.  Psychiatric:        Mood and Affect: Mood normal.        Thought Content: Thought content normal.     UC Treatments / Results  Labs (all labs ordered are listed, but only abnormal results are displayed) Labs Reviewed - No data to display  EKG   Radiology DG Ribs Unilateral W/Chest Left  Result Date: 11/18/2020 CLINICAL DATA:  Injury EXAM: LEFT RIBS AND CHEST - 3+ VIEW COMPARISON:  None. FINDINGS: No fracture or other bone lesions are seen involving the ribs. There is no evidence of pneumothorax or pleural effusion. Both lungs are clear. Heart size and mediastinal contours are within normal limits. IMPRESSION: No displaced fracture  or other radiographic abnormality of the left ribs. Electronically Signed   By: Jearld Lesch M.D.   On: 11/18/2020 13:05    Procedures Procedures (including critical care time)  Medications Ordered in UC Medications - No data to display  Initial Impression / Assessment and Plan / UC Course  I have reviewed the triage vital signs and the nursing notes.  Pertinent labs & imaging results that were available during my care of the patient were reviewed by me and considered in my medical decision making (see chart for details).   Xray without concerning findings. Recommend follow up with any further concerns. Ok to continue ibuprofen if needed.    Final Clinical Impressions(s) / UC Diagnoses   Final diagnoses:  Rib pain on left side   Discharge Instructions   None    ED Prescriptions   None    PDMP not reviewed this encounter.   Tomi Bamberger, PA-C 11/18/20 1333

## 2020-11-18 NOTE — ED Triage Notes (Signed)
Pt states laying on the ground using a shovel to lift a dead cat out of a hole and heard his lt ribs pop. Pt c/o pain on inspiration. States needs a refill on his albuterol inhaler.

## 2020-12-03 ENCOUNTER — Other Ambulatory Visit (HOSPITAL_COMMUNITY): Payer: Self-pay

## 2020-12-03 ENCOUNTER — Other Ambulatory Visit: Payer: Self-pay | Admitting: Physician Assistant

## 2020-12-03 DIAGNOSIS — J45909 Unspecified asthma, uncomplicated: Secondary | ICD-10-CM

## 2020-12-23 ENCOUNTER — Other Ambulatory Visit (HOSPITAL_COMMUNITY): Payer: Self-pay

## 2020-12-23 MED ORDER — CARESTART COVID-19 HOME TEST VI KIT
PACK | 0 refills | Status: AC
  Filled 2020-12-23: qty 4, 4d supply, fill #0

## 2021-01-06 ENCOUNTER — Other Ambulatory Visit: Payer: Self-pay | Admitting: Physician Assistant

## 2021-01-06 ENCOUNTER — Other Ambulatory Visit (HOSPITAL_COMMUNITY): Payer: Self-pay

## 2021-01-06 DIAGNOSIS — J45909 Unspecified asthma, uncomplicated: Secondary | ICD-10-CM

## 2021-01-15 ENCOUNTER — Ambulatory Visit (INDEPENDENT_AMBULATORY_CARE_PROVIDER_SITE_OTHER): Payer: 59 | Admitting: Physician Assistant

## 2021-01-15 ENCOUNTER — Encounter: Payer: Self-pay | Admitting: Physician Assistant

## 2021-01-15 ENCOUNTER — Other Ambulatory Visit: Payer: Self-pay

## 2021-01-15 ENCOUNTER — Other Ambulatory Visit (HOSPITAL_COMMUNITY): Payer: Self-pay

## 2021-01-15 VITALS — BP 122/79 | HR 96 | Temp 97.9°F | Ht 70.0 in | Wt 164.0 lb

## 2021-01-15 DIAGNOSIS — L309 Dermatitis, unspecified: Secondary | ICD-10-CM

## 2021-01-15 DIAGNOSIS — J45909 Unspecified asthma, uncomplicated: Secondary | ICD-10-CM

## 2021-01-15 MED ORDER — ALBUTEROL SULFATE HFA 108 (90 BASE) MCG/ACT IN AERS
2.0000 | INHALATION_SPRAY | Freq: Four times a day (QID) | RESPIRATORY_TRACT | 0 refills | Status: DC | PRN
  Filled 2021-01-15: qty 18, 25d supply, fill #0

## 2021-01-15 MED ORDER — TRIAMCINOLONE ACETONIDE 0.1 % EX CREA
1.0000 "application " | TOPICAL_CREAM | Freq: Two times a day (BID) | CUTANEOUS | 0 refills | Status: DC
  Filled 2021-01-15: qty 30, 15d supply, fill #0

## 2021-01-15 NOTE — Progress Notes (Signed)
° °Established Patient Office Visit ° °Subjective:  °Patient ID: Anthony Ramos, male    DOB: 10/19/1970  Age: 50 y.o. MRN: [id number] ° °CC:  °Chief Complaint  °Patient presents with  ° Follow-up  ° Medication Refill  ° ° °HPI °Anthony Ramos presents for follow up on asthma. Patient also has c/o itchy rash on wrists, elbows and abdomen (left sided). Reports family hx of psoriasis. States was sick with cold-like symptoms few months ago and needed to use albuterol more than usual during that time. Patient reports usually has to use albuterol at night due to wheezing few times per month. No shortness of breath or wheezing with activity or at work.  ° °Past Medical History:  °Diagnosis Date  ° Allergy   ° Asthma   ° ° °Past Surgical History:  °Procedure Laterality Date  ° VASECTOMY    ° ° °Family History  °Problem Relation Age of Onset  ° Heart disease Father   ° Alcohol abuse Father   ° Diabetes Paternal Grandfather   ° Hypertension Paternal Grandfather   ° ° °Social History  ° °Socioeconomic History  ° Marital status: Married  °  Spouse name: Not on file  ° Number of children: Not on file  ° Years of education: Not on file  ° Highest education level: Not on file  °Occupational History  ° Not on file  °Tobacco Use  ° Smoking status: Never  ° Smokeless tobacco: Never  °Vaping Use  ° Vaping Use: Never used  °Substance and Sexual Activity  ° Alcohol use: Yes  °  Alcohol/week: 8.0 standard drinks  °  Types: 8 Cans of beer per week  ° Drug use: No  ° Sexual activity: Yes  °  Birth control/protection: Surgical  °Other Topics Concern  ° Not on file  °Social History Narrative  ° Not on file  ° °Social Determinants of Health  ° °Financial Resource Strain: Not on file  °Food Insecurity: Not on file  °Transportation Needs: Not on file  °Physical Activity: Not on file  °Stress: Not on file  °Social Connections: Not on file  °Intimate Partner Violence: Not on file  ° ° °Outpatient Medications Prior to Visit  °Medication Sig  Dispense Refill  ° COVID-19 At Home Antigen Test ([facility] COVID-19 HOME TEST) KIT Use as directed 4 each 0  ° albuterol (VENTOLIN HFA) 108 (90 Base) MCG/ACT inhaler Inhale 2 puffs into the lungs every 4 (four) hours as needed for wheezing or shortness of breath. 18 g 0  ° °No facility-administered medications prior to visit.  ° ° °Allergies  °Allergen Reactions  ° Penicillins Anaphylaxis  ° ° °ROS °Review of Systems °Review of Systems:  °A fourteen system review of systems was performed and found to be positive as per HPI. °  °Objective:  °  °Physical Exam °General: Pleasant and cooperative, in no acute distress  °Neuro:  Alert and oriented,  extra-ocular muscles intact  °HEENT:  Normocephalic, atraumatic, neck supple °Skin:  erythematous and scaly lesions at volar wrists and elbows, erythematous maculopapular lesions of abdomen (left sided) °Cardiac:  RRR, S1 S2 °Respiratory:  CTA B/L w/o wheezing, crackles or rales, Not using accessory muscles, speaking in full sentences- unlabored. °Vascular:  Ext warm, no cyanosis apprec.; cap RF less 2 sec. °Psych:  No HI/SI, judgement and insight good, Euthymic mood. Full Affect. ° °BP 122/79    Pulse 96    Temp 97.9 °F (36.6 °C)    Ht 5' 10" (1.778   m)    Wt 164 lb (74.4 kg)    SpO2 99%    BMI 23.53 kg/m²  °Wt Readings from Last 3 Encounters:  °01/15/21 164 lb (74.4 kg)  °11/27/19 159 lb 6.4 oz (72.3 kg)  °10/16/18 175 lb (79.4 kg)  ° ° ° °Health Maintenance Due  °Topic Date Due  ° Pneumococcal Vaccine 19-64 Years old (1 - PCV) Never done  ° HIV Screening  Never done  ° Hepatitis C Screening  Never done  ° TETANUS/TDAP  Never done  ° COLONOSCOPY (Pts 45-49yrs Insurance coverage will need to be confirmed)  Never done  ° Zoster Vaccines- Shingrix (1 of 2) Never done  ° COVID-19 Vaccine (4 - Booster for Pfizer series) 03/13/2020  ° INFLUENZA VACCINE  Never done  ° ° °There are no preventive care reminders to display for this patient. ° °No results found for: TSH °Lab Results   °Component Value Date  ° WBC 8.2 05/25/2017  ° HGB 14.9 05/25/2017  ° HCT 44.1 05/25/2017  ° MCV 95.7 05/25/2017  ° PLT 285 05/25/2017  ° °Lab Results  °Component Value Date  ° NA 138 05/25/2017  ° K 4.0 05/25/2017  ° CO2 25 05/25/2017  ° GLUCOSE 88 05/25/2017  ° BUN 16 05/25/2017  ° CREATININE 0.93 05/25/2017  ° CALCIUM 9.6 05/25/2017  ° ANIONGAP 9 05/25/2017  ° °No results found for: CHOL °No results found for: HDL °No results found for: LDLCALC °No results found for: TRIG °No results found for: CHOLHDL °No results found for: HGBA1C ° °  °Assessment & Plan:  ° °Problem List Items Addressed This Visit   ° °  ° Respiratory  ° Asthma - Primary  ° Relevant Medications  ° albuterol (VENTOLIN HFA) 108 (90 Base) MCG/ACT inhaler  ° °Other Visit Diagnoses   ° ° Dermatitis      ° Relevant Medications  ° triamcinolone cream (KENALOG) 0.1 %  ° °  ° ° °Asthma: °-Stable. Discussed with patient if wheezing becomes more frequent at nighttime and needs to use albuterol more than once per week then recommend considering ICS such as Symbicort. Will continue to monitor. Provided refill of albuterol. ° °Dermatitis: °-Discussed with patient rash appearance more suggestive of atopic dermatitis vs psoriasis (no thick scales noted on exam), will start topical corticosteroid therapy. Recommend to apply skin emollient/lubricant.  ° °Meds ordered this encounter  °Medications  ° albuterol (VENTOLIN HFA) 108 (90 Base) MCG/ACT inhaler  °  Sig: Inhale 2 puffs into the lungs every 6 (six) hours as needed for wheezing or shortness of breath.  °  Dispense:  18 g  °  Refill:  0  °  Order Specific Question:   Supervising Provider  °  Answer:   METHENEY, CATHERINE D [[id number]]  ° triamcinolone cream (KENALOG) 0.1 %  °  Sig: Apply 1 application topically 2 (two) times daily.  °  Dispense:  30 g  °  Refill:  0  °  Order Specific Question:   Supervising Provider  °  Answer:   METHENEY, CATHERINE D [[id number]]  ° ° °Follow-up: Return for CPE and FBW in 2 months .   ° ° °Maritza Abonza, PA-C °

## 2021-01-15 NOTE — Patient Instructions (Signed)
Atopic Dermatitis Atopic dermatitis is a skin disorder that causes inflammation of the skin. It is marked by a red rash and itchy, dry, scaly skin. It is the most common type of eczema. Eczema is a group of skin conditions that cause the skin to become rough and swollen. This condition is generally worse during the cooler wintermonths and often improves during the warm summer months. Atopic dermatitis usually starts showing signs in infancy and can last through adulthood. This condition cannot be passed from one person to another (is not contagious). Atopic dermatitis may not always be present, but when it is, it is called aflare-up. What are the causes? The exact cause of this condition is not known. Flare-ups may be triggered by: Coming in contact with something that you are sensitive or allergic to (allergen). Stress. Certain foods. Extremely hot or cold weather. Harsh chemicals and soaps. Dry air. Chlorine. What increases the risk? This condition is more likely to develop in people who have a personal or family history of: Eczema. Allergies. Asthma. Hay fever. What are the signs or symptoms? Symptoms of this condition include: Dry, scaly skin. Red, itchy rash. Itchiness, which can be severe. This may occur before the skin rash. This can make sleeping difficult. Skin thickening and cracking that can occur over time. How is this diagnosed? This condition is diagnosed based on: Your symptoms. Your medical history. A physical exam. How is this treated? There is no cure for this condition, but symptoms can usually be controlled. Treatment focuses on: Controlling the itchiness and scratching. You may be given medicines, such as antihistamines or steroid creams. Limiting exposure to allergens. Recognizing situations that cause stress and developing a plan to manage stress. If your atopic dermatitis does not get better with medicines, or if it is all over your body (widespread), a  treatment using a specific type of light (phototherapy) may be used. Follow these instructions at home: Skin care  Keep your skin well moisturized. Doing this seals in moisture and helps to prevent dryness. Use unscented lotions that have petroleum in them. Avoid lotions that contain alcohol or water. They can dry the skin. Keep baths or showers short (less than 5 minutes) in warm water. Do not use hot water. Use mild, unscented cleansers for bathing. Avoid soap and bubble bath. Apply a moisturizer to your skin right after a bath or shower. Do not apply anything to your skin without checking with your health care provider.  General instructions Take or apply over-the-counter and prescription medicines only as told by your health care provider. Dress in clothes made of cotton or cotton blends. Dress lightly because heat increases itchiness. When washing your clothes, rinse your clothes twice so all of the soap is removed. Avoid any triggers that can cause a flare-up. Keep your fingernails cut short. Avoid scratching. Scratching makes the rash and itchiness worse. A break in the skin from scratching could result in a skin infection (impetigo). Do not be around people who have cold sores or fever blisters. If you get the infection, it may cause your atopic dermatitis to worsen. Keep all follow-up visits. This is important. Contact a health care provider if: Your itchiness interferes with sleep. Your rash gets worse or is not better within one week of starting treatment. You have a fever. You have a rash flare-up after having contact with someone who has cold sores or fever blisters. Get help right away if: You develop pus or soft yellow scabs in the rash area.   Summary Atopic dermatitis causes a red rash and itchy, dry, scaly skin. Treatment focuses on controlling the itchiness and scratching, limiting exposure to things that you are sensitive or allergic to (allergens), recognizing  situations that cause stress, and developing a plan to manage stress. Keep your skin well moisturized. Keep baths or showers shorter than 5 minutes and use warm water. Do not use hot water. This information is not intended to replace advice given to you by your health care provider. Make sure you discuss any questions you have with your healthcare provider. Document Revised: 10/28/2019 Document Reviewed: 10/28/2019 Elsevier Patient Education  2022 Elsevier Inc.  

## 2021-03-16 NOTE — Progress Notes (Unsigned)
Complete physical exam   Patient: Anthony Ramos   DOB: [birth year]   51 y.o. Male  MRN: [id number] Visit Date: 03/19/2021   Ramos chief complaint on file.  Subjective    Anthony Ramos is a 51 y.o. male who presents today for a complete physical exam.  He reports consuming a {diet types:17450} diet. {Exercise:19826} He generally feels {well/fairly well/poorly:18703}. He {does/does not:200015} have additional problems to discuss today.   ***  Past Medical History:  Diagnosis Date   Allergy    Asthma    Past Surgical History:  Procedure Laterality Date   VASECTOMY     Social History   Socioeconomic History   Marital status: Married    Spouse name: Not on file   Number of children: Not on file   Years of education: Not on file   Highest education level: Not on file  Occupational History   Not on file  Tobacco Use   Smoking status: Never   Smokeless tobacco: Never  Vaping Use   Vaping Use: Never used  Substance and Sexual Activity   Alcohol use: Yes    Alcohol/week: 8.0 standard drinks    Types: 8 Cans of beer per week   Drug use: Ramos   Sexual activity: Yes    Birth control/protection: Surgical  Other Topics Concern   Not on file  Social History Narrative   Not on file   Social Determinants of Health   Financial Resource Strain: Not on file  Food Insecurity: Not on file  Transportation Needs: Not on file  Physical Activity: Not on file  Stress: Not on file  Social Connections: Not on file  Intimate Partner Violence: Not on file     Medications: Outpatient Medications Prior to Visit  Medication Sig   albuterol (VENTOLIN HFA) 108 (90 Base) MCG/ACT inhaler Inhale 2 puffs into the lungs every 6 (six) hours as needed for wheezing or shortness of breath.   COVID-19 At Home Antigen Test [facility] COVID-19 HOME TEST) KIT Use as directed   triamcinolone cream (KENALOG) 0.1 % Apply 1 application topically 2 (two) times daily.   Ramos facility-administered  medications prior to visit.    Review of Systems  {Labs   Heme   Chem   Endocrine   Serology   Results Review (optional):23779}  Objective    There were Ramos vitals taken for this visit. {Show previous vital signs (optional):23777}  Physical Exam  ***  Last depression screening scores PHQ 2/9 Scores 01/15/2021 11/27/2019 10/16/2018  PHQ - 2 Score _0 PHQ- 9 Score _1 Last fall risk screening Fall Risk  01/15/2021  Falls in the past year? 0  Number falls in past yr: 0  Injury with Fall? 0  Risk for fall due to : Ramos Fall Risks  Follow up -     Ramos results found for any visits on 03/19/21.  Assessment & Plan    Routine Health Maintenance and Physical Exam  Exercise Activities and Dietary recommendations -Discussed heart healthy diet low in fat and carbohydrates. Recommend moderate exercise 150 mins/wk.  Immunization History  Administered Date(s) Administered   PFIZER(Purple Top)SARS-COV-2 Vaccination 05/16/2019, 06/10/2019, 01/17/2020    Health Maintenance  Topic Date Due   HIV Screening  Never done   Hepatitis C Screening  Never done   TETANUS/TDAP  Never done   COLONOSCOPY (Pts 45-96yr Insurance coverage will need to be confirmed)  Never done   Zoster Vaccines- Shingrix (1  of 2) Never done   COVID-19 Vaccine (4 - Booster for Pfizer series) 03/13/2020   INFLUENZA VACCINE  Never done   HPV VACCINES  Aged Out    Discussed health benefits of physical activity, and encouraged him to engage in regular exercise appropriate for his age and condition.  Problem List Items Addressed This Visit   None    Ramos follow-ups on file.       Lorrene Reid, PA-C  [facility] [phone] (phone) [phone] (fax)  [place]

## 2021-03-19 ENCOUNTER — Encounter: Payer: 59 | Admitting: Physician Assistant

## 2021-05-21 ENCOUNTER — Telehealth: Payer: Self-pay | Admitting: Physician Assistant

## 2021-05-21 ENCOUNTER — Other Ambulatory Visit: Payer: Self-pay | Admitting: Physician Assistant

## 2021-05-21 ENCOUNTER — Other Ambulatory Visit: Payer: Self-pay

## 2021-05-21 ENCOUNTER — Other Ambulatory Visit (HOSPITAL_COMMUNITY): Payer: Self-pay

## 2021-05-21 DIAGNOSIS — J45909 Unspecified asthma, uncomplicated: Secondary | ICD-10-CM

## 2021-05-21 MED ORDER — ALBUTEROL SULFATE HFA 108 (90 BASE) MCG/ACT IN AERS
2.0000 | INHALATION_SPRAY | Freq: Four times a day (QID) | RESPIRATORY_TRACT | 1 refills | Status: DC | PRN
Start: 2021-05-21 — End: 2022-06-30
  Filled 2021-05-21: qty 18, 25d supply, fill #0
  Filled 2022-02-03: qty 6.7, 25d supply, fill #1

## 2021-05-21 NOTE — Telephone Encounter (Signed)
Patient is requesting a refill of his inhaler. Please advise.  ?

## 2021-05-21 NOTE — Telephone Encounter (Signed)
Patient is aware 

## 2021-05-21 NOTE — Telephone Encounter (Signed)
Refill sent.

## 2021-09-17 ENCOUNTER — Other Ambulatory Visit (HOSPITAL_COMMUNITY): Payer: Self-pay

## 2021-09-17 MED ORDER — ALBUTEROL SULFATE HFA 108 (90 BASE) MCG/ACT IN AERS
2.0000 | INHALATION_SPRAY | Freq: Four times a day (QID) | RESPIRATORY_TRACT | 0 refills | Status: DC | PRN
  Filled 2021-09-17: qty 6.7, 25d supply, fill #0

## 2021-09-24 ENCOUNTER — Encounter: Payer: 59 | Admitting: Physician Assistant

## 2021-09-29 NOTE — Progress Notes (Unsigned)
Complete physical exam   Patient: Anthony Ramos   DOB: 09-Apr-1970   51 y.o. Male  MRN: [id number] Visit Date: 09/30/2021   No chief complaint on file.  Subjective    Anthony Ramos is a 51 y.o. male who presents today for a complete physical exam.  He reports consuming a {diet types:17450} diet. {Exercise:19826} He generally feels {well/fairly well/poorly:18703}. He {does/does not:200015} have additional problems to discuss today.   ***  Past Medical History:  Diagnosis Date   Allergy    Asthma    Past Surgical History:  Procedure Laterality Date   VASECTOMY     Social History   Socioeconomic History   Marital status: Married    Spouse name: Not on file   Number of children: Not on file   Years of education: Not on file   Highest education level: Not on file  Occupational History   Not on file  Tobacco Use   Smoking status: Never   Smokeless tobacco: Never  Vaping Use   Vaping Use: Never used  Substance and Sexual Activity   Alcohol use: Yes    Alcohol/week: 8.0 standard drinks of alcohol    Types: 8 Cans of beer per week   Drug use: No   Sexual activity: Yes    Birth control/protection: Surgical  Other Topics Concern   Not on file  Social History Narrative   Not on file   Social Determinants of Health   Financial Resource Strain: Not on file  Food Insecurity: Not on file  Transportation Needs: Not on file  Physical Activity: Not on file  Stress: Not on file  Social Connections: Not on file  Intimate Partner Violence: Not on file     Medications: Outpatient Medications Prior to Visit  Medication Sig   albuterol (VENTOLIN HFA) 108 (90 Base) MCG/ACT inhaler Inhale 2 puffs into the lungs every 6 (six) hours as needed for wheezing or shortness of breath.   albuterol (VENTOLIN HFA) 108 (90 Base) MCG/ACT inhaler Inhale 2 puffs into the lungs every 6 (six) hours as needed for wheezing or shortness of breath   COVID-19 At Home Antigen Test (CARESTART  COVID-19 HOME TEST) KIT Use as directed   triamcinolone cream (KENALOG) 0.1 % Apply 1 application topically 2 (two) times daily.   No facility-administered medications prior to visit.    Review of Systems Review of Systems:  A fourteen system review of systems was performed and found to be positive as per HPI.  {Labs  Heme  Chem  Endocrine  Serology  Results Review (optional):23779}  Objective    There were no vitals taken for this visit. {Show previous vital signs (optional):23777}  Physical Exam   General Appearance:    Well developed, well nourished male. Alert, cooperative, in no acute distress, appears stated age  Head:    Normocephalic, without obvious abnormality, atraumatic  Eyes:    PERRL, conjunctiva/corneas clear, EOM's intact, fundi    benign, both eyes       Ears:    Normal TM's and external ear canals, both ears  Nose:   Nares normal, septum midline, mucosa normal, no drainage   or sinus tenderness  Throat:   Lips, mucosa, and tongue normal; teeth and gums normal  Neck:   Supple, symmetrical, trachea midline, no adenopathy;       thyroid:  No enlargement/tenderness/nodules; no carotid   bruit or JVD  Back:     Symmetric, no curvature, ROM normal, no CVA tenderness  Lungs:     Clear to auscultation bilaterally, respirations unlabored  Chest wall:    No tenderness or deformity  Heart:    Normal heart rate. Normal rhythm. No murmurs, rubs, or gallops.  S1 and S2 normal  Abdomen:     Soft, non-tender, bowel sounds active all four quadrants,    no masses, no organomegaly  Genitalia:    {Exam; genital male:16851::"deferred"}  Rectal:    {Exam; rectal:10444::"deferred"}  Extremities:   All extremities are intact. No cyanosis or edema  Pulses:   2+ and symmetric all extremities  Skin:   Skin color, texture, turgor normal, no rashes or lesions  Lymph nodes:   Cervical, supraclavicular, and axillary nodes normal  Neurologic:   CNII-XII intact. Normal strength,  sensation and reflexes      throughout     Last depression screening scores    01/15/2021   10:22 AM 11/27/2019   10:59 AM 10/16/2018    1:37 PM  PHQ 2/9 Scores  PHQ - 2 Score 3 2 2   PHQ- 9 Score 6 6 4    Last fall risk screening    01/15/2021   10:21 AM  [place] in the past year? 0  Number falls in past yr: 0  Injury with Fall? 0  Risk for fall due to : No Fall Risks     No results found for any visits on 09/30/21.  Assessment & Plan    Routine Health Maintenance and Physical Exam  Exercise Activities and Dietary recommendations -Discussed heart healthy diet low in fat and carbohydrates. Recommend moderate exercise 150 mins/wk.  Immunization History  Administered Date(s) Administered   PFIZER(Purple Top)SARS-COV-2 Vaccination 05/16/2019, 06/10/2019, 01/17/2020    Health Maintenance  Topic Date Due   HIV Screening  Never done   Hepatitis C Screening  Never done   TETANUS/TDAP  Never done   COLONOSCOPY (Pts 45-29yr Insurance coverage will need to be confirmed)  Never done   Zoster Vaccines- Shingrix (1 of 2) Never done   COVID-19 Vaccine (4 - Pfizer series) 03/13/2020   INFLUENZA VACCINE  08/31/2021   HPV VACCINES  Aged Out    Discussed health benefits of physical activity, and encouraged him to engage in regular exercise appropriate for his age and condition.  Problem List Items Addressed This Visit   None    No follow-ups on file.       MLorrene Reid PA-C  [phone](phone) [phone](fax)  [place]

## 2021-09-30 ENCOUNTER — Encounter: Payer: Self-pay | Admitting: Physician Assistant

## 2021-09-30 ENCOUNTER — Ambulatory Visit (INDEPENDENT_AMBULATORY_CARE_PROVIDER_SITE_OTHER): Payer: 59 | Admitting: Physician Assistant

## 2021-09-30 VITALS — BP 133/79 | HR 100 | Temp 97.7°F | Ht 70.0 in | Wt 166.0 lb

## 2021-09-30 DIAGNOSIS — Z0000 Encounter for general adult medical examination without abnormal findings: Secondary | ICD-10-CM

## 2021-09-30 DIAGNOSIS — Z1211 Encounter for screening for malignant neoplasm of colon: Secondary | ICD-10-CM | POA: Diagnosis not present

## 2021-09-30 NOTE — Patient Instructions (Signed)

## 2021-10-22 ENCOUNTER — Other Ambulatory Visit: Payer: Self-pay

## 2021-10-22 DIAGNOSIS — Z130 Encounter for screening for diseases of the blood and blood-forming organs and certain disorders involving the immune mechanism: Secondary | ICD-10-CM

## 2021-10-22 DIAGNOSIS — Z0000 Encounter for general adult medical examination without abnormal findings: Secondary | ICD-10-CM

## 2021-10-26 ENCOUNTER — Other Ambulatory Visit: Payer: 59

## 2022-01-19 DIAGNOSIS — N50811 Right testicular pain: Secondary | ICD-10-CM | POA: Diagnosis not present

## 2022-02-03 ENCOUNTER — Other Ambulatory Visit: Payer: Self-pay

## 2022-02-04 ENCOUNTER — Other Ambulatory Visit (HOSPITAL_COMMUNITY): Payer: Self-pay

## 2022-02-25 ENCOUNTER — Telehealth: Payer: 59 | Admitting: Family Medicine

## 2022-02-25 ENCOUNTER — Other Ambulatory Visit (HOSPITAL_COMMUNITY): Payer: Self-pay

## 2022-02-25 DIAGNOSIS — U071 COVID-19: Secondary | ICD-10-CM | POA: Diagnosis not present

## 2022-02-25 MED ORDER — PROMETHAZINE-DM 6.25-15 MG/5ML PO SYRP
5.0000 mL | ORAL_SOLUTION | Freq: Four times a day (QID) | ORAL | 0 refills | Status: AC | PRN
  Filled 2022-02-25: qty 118, 6d supply, fill #0

## 2022-02-25 MED ORDER — PREDNISONE 20 MG PO TABS
20.0000 mg | ORAL_TABLET | Freq: Two times a day (BID) | ORAL | 0 refills | Status: AC
  Filled 2022-02-25: qty 10, 5d supply, fill #0

## 2022-02-25 NOTE — Patient Instructions (Signed)
Quarantine and Isolation Quarantine and isolation refer to local and travel restrictions to protect the public and travelers from contagious diseases that constitute a public health threat. Contagious diseases are diseases that can spread from one person to another. Quarantine and isolation help to protect the public by preventing exposure to people who have or may have a contagious disease. Isolation separates people who are sick with a contagious disease from people who are not sick. Quarantine separates and restricts the movement of people who were exposed to a contagious disease to see if they become sick. You may be put in quarantine or isolation if you have been exposed to or diagnosed with any of the following diseases: Severe acute respiratory syndromes, such as COVID-19. Cholera. Diphtheria. Tuberculosis. Plague. Smallpox. Yellow fever. Viral hemorrhagic fevers, such as [place], Ebola, and [place]. When to quarantine or isolate Follow these rules, whether you have been vaccinated or not: Stay home and isolate from others when you are sick with a contagious disease. Isolate when you test positive for a contagious disease, even if you do not have symptoms. Isolate if you are sick and suspect that you may have a contagious disease. If you suspect that you have a contagious disease, get tested. If your test results are negative, you can end your isolation. If your test results are positive, follow the full isolation recommendations as told by your health care provider or local health authorities. Quarantine and stay away from others when you have been in close contact with someone who has tested positive for a contagious disease. Close contact is defined as being less than 6 ft (1.8 m) away from an infected person for a total of 15 minutes or more over a 24-hour period. Do not go to places where you are unable to wear a mask, such as restaurants and some gyms. Stay home and separate  from others as much as possible. Avoid being around people who may get very sick from the contagious disease that you have. Use a separate bathroom, if possible. Do not travel. For travel guidance, visit the CDC's travel webpage at wwwnc.cdc.gov/travel/ Follow these instructions at home: Medicines  Take over-the-counter and prescription medicines as told by your health care provider. Finish all antibiotic medicine even when you start to feel better. Stay up to date with all your vaccines. Get scheduled vaccines and boosters as recommended by your health care provider. Lifestyle Wear a high-quality mask if you must be around others at home and in public, if recommended. Improve air flow (ventilation) at home to help prevent the disease from spreading to other people, if possible. Do not share personal household items, like cups, towels, and utensils. Practice everyday hygiene and cleaning. General instructions Talk to your health care provider if you have a weakened body defense system (immune system). People with a weakened immune system may have a reduced immune response to vaccines. You may need to follow current prevention measures, including wearing a well-fitting mask, avoiding crowds, and avoiding poorly ventilated indoor places. Monitor symptoms and follow health care provider instructions, which may include resting, drinking fluids, and taking medicines. Follow specific isolation and quarantine recommendations if you are in places that can lead to disease outbreaks, such as correctional and detention facilities, homeless shelters, and cruise ships. Return to your normal activities as told by your health care provider. Ask your health care provider what activities are safe for you. Keep all follow-up visits. This is important. Where to find more information CDC: www.cdc.gov/quarantine/index.html Contact   a health care provider if: You have a fever. You have signs and symptoms that  return or get worse after isolation. Get help right away if: You have difficulty breathing. You have chest pain. These symptoms may be an emergency. Get help right away. Call 911. Do not wait to see if the symptoms will go away. Do not drive yourself to the hospital. Summary Isolation and quarantine help protect the public by preventing exposure to people who have or may have a contagious disease. Isolate when you are sick or when you test positive, even if you do not have symptoms. Quarantine and stay away from others when you have been in close contact with someone who has tested positive for a contagious disease. This information is not intended to replace advice given to you by your health care provider. Make sure you discuss any questions you have with your health care provider. Document Revised: 01/28/2021 Document Reviewed: 01/07/2021 Elsevier Patient Education  2023 Elsevier Inc. COVID-19 COVID-19, or coronavirus disease 2019, is an infection that is caused by a new (novel) coronavirus called SARS-CoV-2. COVID-19 can cause many symptoms. In some people, the virus may not cause any symptoms. In others, it may cause mild or severe symptoms. Some people with severe infection develop severe disease. What are the causes? This illness is caused by a virus. The virus may be in the air as tiny specks of fluid (aerosols) or droplets, or it may be on surfaces. You may catch the virus by: Breathing in droplets from an infected person. Droplets can be spread by a person breathing, speaking, singing, coughing, or sneezing. Touching something, like a table or a doorknob, that has virus on it (is contaminated) and then touching your mouth, nose, or eyes. What increases the risk? Risk for infection: You are more likely to get infected with the COVID-19 virus if: You are within 6 ft (1.8 m) of a person with COVID-19 for 15 minutes or longer. You are providing care for a person who is infected with  COVID-19. You are in close personal contact with other people. Close personal contact includes hugging, kissing, or sharing eating or drinking utensils. Risk for serious illness caused by COVID-19: You are more likely to get seriously ill from the COVID-19 virus if: You have cancer. You have a long-term (chronic) disease, such as: Chronic lung disease. This includes pulmonary embolism, chronic obstructive pulmonary disease, and cystic fibrosis. Long-term disease that lowers your body's ability to fight infection (immunocompromise). Serious cardiac conditions, such as heart failure, coronary artery disease, or cardiomyopathy. Diabetes. Chronic kidney disease. Liver diseases. These include cirrhosis, nonalcoholic fatty liver disease, alcoholic liver disease, or autoimmune hepatitis. You have obesity. You are pregnant or were recently pregnant. You have sickle cell disease. What are the signs or symptoms? Symptoms of this condition can range from mild to severe. Symptoms may appear any time from 2 to 14 days after being exposed to the virus. They include: Fever or chills. Shortness of breath or trouble breathing. Feeling tired or very tired. Headaches, body aches, or muscle aches. Runny or stuffy nose, sneezing, coughing, or sore throat. New loss of taste or smell. This is rare. Some people may also have stomach problems, such as nausea, vomiting, or diarrhea. Other people may not have any symptoms of COVID-19. How is this diagnosed? This condition may be diagnosed by testing samples to check for the COVID-19 virus. The most common tests are the PCR test and the antigen test. Tests may be done   in the lab or at home. They include: Using a swab to take a sample of fluid from the back of your nose and throat (nasopharyngeal fluid), from your nose, or from your throat. Testing a sample of saliva from your mouth. Testing a sample of coughed-up mucus from your lungs (sputum). How is this  treated? Treatment for COVID-19 infection depends on the severity of the condition. Mild symptoms can be managed at home with rest, fluids, and over-the-counter medicines. Serious symptoms may be treated in a hospital intensive care unit (ICU). Treatment in the ICU may include: Supplemental oxygen. Extra oxygen is given through a tube in the nose, a face mask, or a hood. Medicines. These may include: Antivirals, such as monoclonal antibodies. These help your body fight off certain viruses that can cause disease. Anti-inflammatories, such as corticosteroids. These reduce inflammation and suppress the immune system. Antithrombotics. These prevent or treat blood clots, if they develop. Convalescent plasma. This helps boost your immune system, if you have an underlying immunosuppressive condition or are getting immunosuppressive treatments. Prone positioning. This means you will lie on your stomach. This helps oxygen to get into your lungs. Infection control measures. If you are at risk for more serious illness caused by COVID-19, your health care provider may prescribe two long-acting monoclonal antibodies, given together every 6 months. How is this prevented? To protect yourself: Use preventive medicine (pre-exposure prophylaxis). You may get pre-exposure prophylaxis if you have moderate or severe immunocompromise. Get vaccinated. Anyone 6 months old or older who meets guidelines can get a COVID-19 vaccine or vaccine series. This includes people who are pregnant or making breast milk (lactating). Get an added dose of COVID-19 vaccine after your first vaccine or vaccine series if you have moderate to severe immunocompromise. This applies if you have had a solid organ transplant or have been diagnosed with an immunocompromising condition. You should get the added dose 4 weeks after you got the first COVID-19 vaccine or vaccine series. If you get an mRNA vaccine, you will need a 3-dose primary  series. If you get the J&J/Janssen vaccine, you will need a 2-dose primary series, with the second dose being an mRNA vaccine. Talk to your health care provider about getting experimental monoclonal antibodies. This treatment is approved under emergency use authorization to prevent severe illness before or after being exposed to the COVID-19 virus. You may be given monoclonal antibodies if: You have moderate or severe immunocompromise. This includes treatments that lower your immune response. People with immunocompromise may not develop protection against COVID-19 when they are vaccinated. You cannot be vaccinated. You may not get a vaccine if you have a severe allergic reaction to the vaccine or its components. You are not fully vaccinated. You are in a facility where COVID-19 is present and: Are in close contact with a person who is infected with the COVID-19 virus. Are at high risk of being exposed to the COVID-19 virus. You are at risk of illness from new variants of the COVID-19 virus. To protect others: If you have symptoms of COVID-19, take steps to prevent the virus from spreading to others. Stay home. Leave your house only to get medical care. Do not use public transit, if possible. Do not travel while you are sick. Wash your hands often with soap and water for at least 20 seconds. If soap and water are not available, use alcohol-based hand sanitizer. Make sure that all people in your household wash their hands well and often. Cough or sneeze   into a tissue or your sleeve or elbow. Do not cough or sneeze into your hand or into the air. Where to find more information Centers for Disease Control and Prevention: www.cdc.gov/coronavirus World Health Organization: www.who.int/health-topics/coronavirus Get help right away if: You have trouble breathing. You have pain or pressure in your chest. You are confused. You have bluish lips and fingernails. You have trouble waking from sleep. You  have symptoms that get worse. These symptoms may be an emergency. Get help right away. Call 911. Do not wait to see if the symptoms will go away. Do not drive yourself to the hospital. Summary COVID-19 is an infection that is caused by a new coronavirus. Sometimes, there are no symptoms. Other times, symptoms range from mild to severe. Some people with a severe COVID-19 infection develop severe disease. The virus that causes COVID-19 can spread from person to person through droplets or aerosols from breathing, speaking, singing, coughing, or sneezing. Mild symptoms of COVID-19 can be managed at home with rest, fluids, and over-the-counter medicines. This information is not intended to replace advice given to you by your health care provider. Make sure you discuss any questions you have with your health care provider. Document Revised: 01/05/2021 Document Reviewed: 01/07/2021 Elsevier Patient Education  2023 Elsevier Inc.  

## 2022-02-25 NOTE — Progress Notes (Signed)
Virtual Visit Consent   Anthony Ramos, you are scheduled for a virtual visit with a [facility] provider today. Just as with appointments in the office, your consent must be obtained to participate. Your consent will be active for this visit and any virtual visit you may have with one of our providers in the next 365 days. If you have a MyChart account, a copy of this consent can be sent to you electronically.  As this is a virtual visit, video technology does not allow for your provider to perform a traditional examination. This may limit your provider's ability to fully assess your condition. If your provider identifies any concerns that need to be evaluated in person or the need to arrange testing (such as labs, EKG, etc.), we will make arrangements to do so. Although advances in technology are sophisticated, we cannot ensure that it will always work on either your end or our end. If the connection with a video visit is poor, the visit may have to be switched to a telephone visit. With either a video or telephone visit, we are not always able to ensure that we have a secure connection.  By engaging in this virtual visit, you consent to the provision of healthcare and authorize for your insurance to be billed (if applicable) for the services provided during this visit. Depending on your insurance coverage, you may receive a charge related to this service.  I need to obtain your verbal consent now. Are you willing to proceed with your visit today? [name] has provided verbal consent on 02/25/2022 for a virtual visit (video or telephone). Dellia Nims, FNP  Date: 02/25/2022 12:36 PM  Virtual Visit via Video Note   I, Dellia Nims, connected with  Anthony Ramos  ([id number], 1970/08/10) on 02/25/22 at 12:30 PM EST by a video-enabled telemedicine application and verified that I am speaking with the correct person using two identifiers.  Location: Patient: Virtual Visit Location Patient:  Home Provider: Virtual Visit Location Provider: Home Office   I discussed the limitations of evaluation and management by telemedicine and the availability of in person appointments. The patient expressed understanding and agreed to proceed.    History of Present Illness: Anthony Ramos is a 52 y.o. who identifies as a male who was assigned male at birth, and is being seen today for cough, fever, chills, body aches and headaches with history of asthma. He says he tested positive at home for covid and his wife also has it. He declines antivirals. In no distress. Marland Kitchen  HPI: HPI  Problems:  Patient Active Problem List   Diagnosis Date Noted   Healthcare maintenance 11/24/2016   Fatigue 11/24/2016   Asthma 11/24/2016   Injury of left toe 11/24/2016   Unspecified asthma(493.90) 05/08/2012    Allergies:  Allergies  Allergen Reactions   Penicillins Anaphylaxis   Medications:  Current Outpatient Medications:    predniSONE (DELTASONE) 20 MG tablet, Take 1 tablet (20 mg total) by mouth 2 (two) times daily with a meal for 5 days., Disp: 10 tablet, Rfl: 0   promethazine-dextromethorphan (PROMETHAZINE-DM) 6.25-15 MG/5ML syrup, Take 5 mLs by mouth 4 (four) times daily as needed for up to 10 days for cough., Disp: 118 mL, Rfl: 0   albuterol (VENTOLIN HFA) 108 (90 Base) MCG/ACT inhaler, Inhale 2 puffs into the lungs every 6 (six) hours as needed for wheezing or shortness of breath., Disp: 18 g, Rfl: 1   albuterol (VENTOLIN HFA) 108 (90 Base) MCG/ACT inhaler, Inhale 2  puffs into the lungs every 6 (six) hours as needed for wheezing or shortness of breath, Disp: 6.7 g, Rfl: 0   COVID-19 At Home Antigen Test (CARESTART COVID-19 HOME TEST) KIT, Use as directed, Disp: 4 each, Rfl: 0   triamcinolone cream (KENALOG) 0.1 %, Apply 1 application topically 2 (two) times daily., Disp: 30 g, Rfl: 0  Observations/Objective: Patient is well-developed, well-nourished in no acute distress.  Resting comfortably  at home.   Head is normocephalic, atraumatic.  No labored breathing.  Speech is clear and coherent with logical content.  Patient is alert and oriented at baseline.    Assessment and Plan: 1. COVID-19  Increase fluids, humidifier at night, MVI with Vit d and zinc, quarantine discussed, urgetn care if sx worsen.   Follow Up Instructions: I discussed the assessment and treatment plan with the patient. The patient was provided an opportunity to ask questions and all were answered. The patient agreed with the plan and demonstrated an understanding of the instructions.  A copy of instructions were sent to the patient via MyChart unless otherwise noted below.     The patient was advised to call back or seek an in-person evaluation if the symptoms worsen or if the condition fails to improve as anticipated.  Time:  I spent 10 minutes with the patient via telehealth technology discussing the above problems/concerns.    Dellia Nims, FNP

## 2022-05-06 ENCOUNTER — Ambulatory Visit (INDEPENDENT_AMBULATORY_CARE_PROVIDER_SITE_OTHER): Payer: 59

## 2022-05-06 ENCOUNTER — Ambulatory Visit
Admission: EM | Admit: 2022-05-06 | Discharge: 2022-05-06 | Disposition: A | Discharge status: Home / Self Care | Payer: 59 | Attending: Internal Medicine | Admitting: Internal Medicine

## 2022-05-06 DIAGNOSIS — M79641 Pain in right hand: Secondary | ICD-10-CM

## 2022-05-06 DIAGNOSIS — M19041 Primary osteoarthritis, right hand: Secondary | ICD-10-CM | POA: Diagnosis not present

## 2022-05-06 NOTE — ED Provider Notes (Addendum)
[facility]    CSN: [id number] Arrival date & time: 05/06/22  1251      History   Chief Complaint Chief Complaint  Patient presents with   Hand Pain    HPI Anthony Ramos is a 52 y.o. male.   Patient presents with right hand pain and swelling that started about 2 days ago.  Reports the majority of the pain is located in the third and fourth knuckles.  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  Has taken ibuprofen and used ice application with no improvement in pain.   Hand Pain    Past Medical History:  Diagnosis Date   Allergy    Asthma     Patient Active Problem List   Diagnosis Date Noted   Healthcare maintenance 11/24/2016   Fatigue 11/24/2016   Asthma 11/24/2016   Injury of left toe 11/24/2016   Unspecified asthma(493.90) 05/08/2012    Past Surgical History:  Procedure Laterality Date   VASECTOMY         Home Medications    Prior to Admission medications   Medication Sig Start Date End Date Taking? Authorizing Provider  albuterol (VENTOLIN HFA) 108 (90 Base) MCG/ACT inhaler Inhale 2 puffs into the lungs every 6 (six) hours as needed for wheezing or shortness of breath. 05/21/21   Abonza, Maritza, PA-C  albuterol (VENTOLIN HFA) 108 (90 Base) MCG/ACT inhaler Inhale 2 puffs into the lungs every 6 (six) hours as needed for wheezing or shortness of breath 05/21/21     COVID-19 At Home Antigen Test  [facility] COVID-19 HOME TEST) KIT Use as directed 12/23/20   Driscilla GrammesFurr, Stephen R, RPH  triamcinolone cream (KENALOG) 0.1 % Apply 1 application topically 2 (two) times daily. 01/15/21   Mayer MaskerAbonza, Maritza, PA-C    Family History Family History  Problem Relation Age of Onset   Heart disease Father    Alcohol abuse Father    Diabetes Paternal Grandfather    Hypertension Paternal Grandfather     Social  History Social History   Tobacco Use   Smoking status: Never   Smokeless tobacco: Never  Vaping Use   Vaping Use: Never used  Substance Use Topics   Alcohol use: Yes    Alcohol/week: 8.0 standard drinks of alcohol    Types: 8 Cans of beer per week   Drug use: No     Allergies   Penicillins   Review of Systems Review of Systems Per HPI  Physical Exam Triage Vital Signs ED Triage Vitals  Enc Vitals Group     BP 05/06/22 1259 125/89     Pulse Rate 05/06/22 1259 97     Resp 05/06/22 1259 17     Temp 05/06/22 1259 (!) 97.4 F (36.3 C)     Temp Source 05/06/22 1259 Oral     SpO2 05/06/22 1259 96 %     Weight --      Height --      Head Circumference --      Peak Flow --      Pain Score 05/06/22 1303 2     Pain Loc --      Pain Edu? --      Excl. in GC? --    No data found.  Updated Vital Signs BP 125/89 (BP Location: Left Arm)   Pulse 97  Temp (!) 97.4 F (36.3 C) (Oral)   Resp 17   SpO2 96%   Visual Acuity Right Eye Distance:   Left Eye Distance:   Bilateral Distance:    Right Eye Near:   Left Eye Near:    Bilateral Near:     Physical Exam Constitutional:      General: He is not in acute distress.    Appearance: Normal appearance. He is not toxic-appearing or diaphoretic.  HENT:     Head: Normocephalic and atraumatic.  Eyes:     Extraocular Movements: Extraocular movements intact.     Conjunctiva/sclera: Conjunctivae normal.  Pulmonary:     Effort: Pulmonary effort is normal.  Musculoskeletal:     Comments: Patient has mild swelling present out the third and fourth MCP joints of the right hand. no discoloration, lacerations, abrasions noted.  Full range of motion of fingers and hand present.  Grip strength 5/5.  Neurovascular intact.  Neurological:     General: No focal deficit present.     Mental Status: He is alert and oriented to person, place, and time. Mental status is at baseline.  Psychiatric:        Mood and Affect: Mood normal.         Behavior: Behavior normal.        Thought Content: Thought content normal.        Judgment: Judgment normal.      UC Treatments / Results  Labs (all labs ordered are listed, but only abnormal results are displayed) Labs Reviewed - No data to display  EKG   Radiology DG Hand Complete Right  Result Date: 05/06/2022 CLINICAL DATA:  Hand pain. EXAM: RIGHT HAND - COMPLETE 3+ VIEW COMPARISON:  None Available. FINDINGS: There is 2 mm ulnar negative variance. Minimal peripheral DIP degenerative spurring without significant joint space narrowing. Mild-to-moderate index finger metacarpophalangeal joint space narrowing and peripheral metacarpal head degenerative spurring. Mild thumb metacarpal head peripheral degenerative osteophytosis. No acute fracture or dislocation. No cortical erosion or periostitis. IMPRESSION: 1. 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 2. No acute fracture. Electronically Signed   By: Neita Garnet M.D.   On: 05/06/2022 13:32    Procedures Procedures (including critical care time)  Medications Ordered in UC Medications - No data to display  Initial Impression / Assessment and Plan / UC Course  I have reviewed the triage vital signs and the nursing notes.  Pertinent labs & imaging results that were available during my care of the patient were reviewed by me and considered in my medical decision making (see chart for details).     Right hand x-ray was negative for any acute bony abnormality.  Patient does have quite a bit of arthritis/degenerative changes in the hand but is not specifically located where the pain and swelling is on exam.  Therefore, suspect possible contusion/inflammation injury.  Advised NSAIDs, elevation extremity, ice application.  There is an ulnar variance that is very minimal but patient is not experiencing any wrist pain or injury so this is most likely incidental finding and benign. No signs of bacterial  infection on exam.  Advised follow-up with hand specialty if symptoms persist or worsen.  Patient verbalized understanding and was agreeable with plan. Final Clinical Impressions(s) / UC Diagnoses   Final diagnoses:  Right hand pain     Discharge Instructions      Take ibuprofen and use ice application.  Follow-up with hand specialty if symptoms persist or worsen.  ED Prescriptions   None    PDMP not reviewed this encounter.   Gustavus BryantMound, Jiles Goya E, [place]FNP 05/06/22 1343    Gustavus BryantMound, Jaiden Wahab E, [place]FNP 05/06/22 1344

## 2022-05-06 NOTE — ED Triage Notes (Addendum)
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   Pt has taken ibuprofen and iced hand hasn't helped any with pain or swelling.

## 2022-05-06 NOTE — Discharge Instructions (Signed)
Take ibuprofen and use ice application.  Follow-up with hand specialty if symptoms persist or worsen.

## 2022-06-28 ENCOUNTER — Other Ambulatory Visit (HOSPITAL_COMMUNITY): Payer: Self-pay

## 2022-06-30 ENCOUNTER — Encounter: Payer: Self-pay | Admitting: Family Medicine

## 2022-06-30 ENCOUNTER — Other Ambulatory Visit (HOSPITAL_COMMUNITY): Payer: Self-pay

## 2022-06-30 ENCOUNTER — Ambulatory Visit: Payer: 59 | Admitting: Family Medicine

## 2022-06-30 VITALS — BP 116/74 | HR 98 | Temp 97.9°F | Ht 70.0 in | Wt 164.0 lb

## 2022-06-30 DIAGNOSIS — L309 Dermatitis, unspecified: Secondary | ICD-10-CM

## 2022-06-30 DIAGNOSIS — Z1211 Encounter for screening for malignant neoplasm of colon: Secondary | ICD-10-CM | POA: Diagnosis not present

## 2022-06-30 DIAGNOSIS — L2082 Flexural eczema: Secondary | ICD-10-CM

## 2022-06-30 DIAGNOSIS — Z1212 Encounter for screening for malignant neoplasm of rectum: Secondary | ICD-10-CM

## 2022-06-30 DIAGNOSIS — Z0000 Encounter for general adult medical examination without abnormal findings: Secondary | ICD-10-CM | POA: Diagnosis not present

## 2022-06-30 DIAGNOSIS — L4050 Arthropathic psoriasis, unspecified: Secondary | ICD-10-CM | POA: Insufficient documentation

## 2022-06-30 DIAGNOSIS — F419 Anxiety disorder, unspecified: Secondary | ICD-10-CM | POA: Diagnosis not present

## 2022-06-30 DIAGNOSIS — M25441 Effusion, right hand: Secondary | ICD-10-CM

## 2022-06-30 DIAGNOSIS — J45909 Unspecified asthma, uncomplicated: Secondary | ICD-10-CM | POA: Diagnosis not present

## 2022-06-30 MED ORDER — ALBUTEROL SULFATE HFA 108 (90 BASE) MCG/ACT IN AERS
2.0000 | INHALATION_SPRAY | Freq: Four times a day (QID) | RESPIRATORY_TRACT | 1 refills | Status: DC | PRN
  Filled 2022-06-30: qty 6.7, 25d supply, fill #0
  Filled 2022-11-22: qty 6.7, 25d supply, fill #1

## 2022-06-30 MED ORDER — TRIAMCINOLONE ACETONIDE 0.1 % EX CREA
1.0000 | TOPICAL_CREAM | Freq: Two times a day (BID) | CUTANEOUS | 0 refills | Status: AC
  Filled 2022-06-30: qty 30, 15d supply, fill #0

## 2022-06-30 NOTE — Assessment & Plan Note (Signed)
Was previously taking Xanax.  No longer taking this.  Uses breathing techniques and meditation's now.  Most of his anxiety is centered around conflict with neighbor.  States he did not react well to SSRIs in the distant past.     06/30/2022   10:38 AM 09/30/2021    1:36 PM 01/15/2021   10:22 AM  GAD 7 : Generalized Anxiety Score  Nervous, Anxious, on Edge 3 2 3   Control/stop worrying 2 2 3   Worry too much - different things 2 3 3   Trouble relaxing 2 2 3   Restless 2 0 0  Easily annoyed or irritable 1 0 1  Afraid - awful might happen 3 3 3   Total GAD 7 Score 15 12 16   Anxiety Difficulty Somewhat difficult Not difficult at all Somewhat difficult

## 2022-06-30 NOTE — Assessment & Plan Note (Signed)
Still some significant swelling in the right third carpometacarpal joint 8 weeks out after hitting it against some scaffolding.  Initial x-ray was negative except for degenerative changes of the index finger.  No abnormality seen in the finger that is currently swollen.  Discussed pain options with patient.  He would like to be evaluated by sports medicine.  Will put in referral.

## 2022-06-30 NOTE — Progress Notes (Signed)
Established Patient Office Visit  Subjective   Patient ID: Anthony Ramos, male    DOB: December 23, 1970  Age: 52 y.o. MRN: [id number]  Chief Complaint  Patient presents with   Medication Refill    HPI  Asthma -patient uses his inhaler mostly at night.  Wakes up coughing sometimes.  Will use his inhaler 3-4 times a week.  Works in an area with dust exposure but wears a respirator.  Eczema-patient mostly gets eczema when his allergies act up.  It usually occurs on the arms and elbows.  Uses triamcinolone cream.  Needs a refill.  Colonoscopy-patient states he knows he needs to get a colonoscopy but is too busy at the moment to take 2 days off for the prep and procedure.  He is willing to do the Cologuard.  We discussed pros and cons of both.  Right hand swelling.  Patient has swelling at the third carpometacarpal joint from an injury to it almost 2 months ago.  Swelling "comes and goes".  Had it x-rayed at the urgent care when it first occurred.  Patient not currently taking anything for the pain.  Patient declines lab workup today.  Patient declines shingles vaccine but is interested in getting it in the future.    ROS    Objective:     BP 116/74   Pulse 98   Temp 97.9 F (36.6 C) (Oral)   Ht 5\' 10"  (1.778 m)   Wt 164 lb (74.4 kg)   SpO2 98%   BMI 23.53 kg/m    Physical Exam General: Alert, oriented CV: Regular rate and rhythm no murmurs Pulmonary: Lungs clear bilaterally no wheezes. MSK: Generalized swelling of the third carpometacarpal joint.  No results found for any visits on 06/30/22.    The ASCVD Risk score (Arnett DK, et al., 2019) failed to calculate for the following reasons:   Cannot find a previous HDL lab   Cannot find a previous total cholesterol lab    Assessment & Plan:   Problem List Items Addressed This Visit       Respiratory   Asthma    Symptoms mild.  Refill albuterol.      Relevant Medications   albuterol (VENTOLIN HFA) 108 (90 Base)  MCG/ACT inhaler     Musculoskeletal and Integument   Eczema    Usually occurring on the elbows and forearms.  Triamcinolone as needed.  Refilled.        Other   Anxiety    Was previously taking Xanax.  No longer taking this.  Uses breathing techniques and meditation's now.  Most of his anxiety is centered around conflict with neighbor.  States he did not react well to SSRIs in the distant past.     06/30/2022   10:38 AM 09/30/2021    1:36 PM 01/15/2021   10:22 AM  GAD 7 : Generalized Anxiety Score  Nervous, Anxious, on Edge 3 2 3   Control/stop worrying 2 2 3   Worry too much - different things 2 3 3   Trouble relaxing 2 2 3   Restless 2 0 0  Easily annoyed or irritable 1 0 1  Afraid - awful might happen 3 3 3   Total GAD 7 Score 15 12 16   Anxiety Difficulty Somewhat difficult Not difficult at all Somewhat difficult         Healthcare maintenance    Declines lipid panel and A1c Agreeable to Cologuard.  Will put in order.      Swelling of joint of  right hand    Still some significant swelling in the right third carpometacarpal joint 8 weeks out after hitting it against some scaffolding.  Initial x-ray was negative except for degenerative changes of the index finger.  No abnormality seen in the finger that is currently swollen.  Discussed pain options with patient.  He would like to be evaluated by sports medicine.  Will put in referral.      Other Visit Diagnoses     Screening for colorectal cancer    -  Primary   Relevant Orders   Cologuard   Dermatitis       Relevant Medications   triamcinolone cream (KENALOG) 0.1 %       Return in about 1 year (around 06/30/2023) for Annual physical.    Sandre Kitty, MD

## 2022-06-30 NOTE — Assessment & Plan Note (Signed)
Declines lipid panel and A1c Agreeable to Cologuard.  Will put in order.

## 2022-06-30 NOTE — Assessment & Plan Note (Signed)
Symptoms mild.  Refill albuterol.

## 2022-06-30 NOTE — Assessment & Plan Note (Signed)
Usually occurring on the elbows and forearms.  Triamcinolone as needed.  Refilled.

## 2022-06-30 NOTE — Patient Instructions (Signed)
It was nice to meet you today,  I have sent in a refill for your inhaler as well as your triamcinolone cream.  I have also sent in a order for the Cologuard.  Someone from Cologuard will reach out to you regarding this.  Follow-up in 1 year or sooner if needed  Have a great day,  Frederic Jericho, MD

## 2022-07-15 ENCOUNTER — Other Ambulatory Visit (HOSPITAL_COMMUNITY): Payer: Self-pay

## 2022-07-15 ENCOUNTER — Other Ambulatory Visit: Payer: Self-pay

## 2022-07-15 ENCOUNTER — Ambulatory Visit (INDEPENDENT_AMBULATORY_CARE_PROVIDER_SITE_OTHER): Payer: 59 | Admitting: Family Medicine

## 2022-07-15 VITALS — BP 129/88 | Ht 72.0 in | Wt 170.0 lb

## 2022-07-15 DIAGNOSIS — M79642 Pain in left hand: Secondary | ICD-10-CM | POA: Diagnosis not present

## 2022-07-15 DIAGNOSIS — M79641 Pain in right hand: Secondary | ICD-10-CM

## 2022-07-15 DIAGNOSIS — R229 Localized swelling, mass and lump, unspecified: Secondary | ICD-10-CM

## 2022-07-15 MED ORDER — IBUPROFEN 800 MG PO TABS
800.0000 mg | ORAL_TABLET | Freq: Three times a day (TID) | ORAL | 0 refills | Status: AC | PRN
  Filled 2022-07-15: qty 30, 10d supply, fill #0

## 2022-07-15 MED ORDER — SULFAMETHOXAZOLE-TRIMETHOPRIM 800-160 MG PO TABS
1.0000 | ORAL_TABLET | Freq: Two times a day (BID) | ORAL | 0 refills | Status: AC
  Filled 2022-07-15: qty 10, 5d supply, fill #0

## 2022-07-15 NOTE — Progress Notes (Unsigned)
   Established Patient Office Visit  Subjective   Patient ID: Anthony Ramos, male    DOB: 1970-11-01  Age: 52 y.o. MRN: [id number]  Bilateral right greater than left hand pain.  He is here today with chief complaint of bilateral hand pain right greater than left, right third finger joint pain, soreness.  He reports he noticed this about 2 months ago and his symptoms have not improved.  He was seen at urgent care with x-rays that did not reveal any obvious bony abnormality to that finger.  He was given prescription for anti-inflammatory medication.  He reports it has not resolved his pain.  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  He describes the pain as soreness.  He has full range of motion.  Denies any specific injury to his hand.   ROS as listed above in HPI    Objective:     BP 129/88   Ht 6' (1.829 m)   Wt 170 lb (77.1 kg)   BMI 23.06 kg/m   Physical Exam Vitals reviewed.  Constitutional:      General: He is not in acute distress.    Appearance: Normal appearance. He is normal weight. He is not ill-appearing, toxic-appearing or diaphoretic.  Pulmonary:     Effort: Pulmonary effort is normal.  Neurological:     Mental Status: He is alert.   Right hand: No obvious deformity.  He does have some swelling around the third MCP some slight erythema.  No tenderness to palpation of the third finger.  Full range of motion with flexion and extension of that finger.  Grip strength 5/5 bilaterally.  Sensation intact to light touch.  Radial pulse 2+.  Limited ultrasound: Right long finger MCP joint was visualized, no hypoechoic fluid surrounding the joint.  No bony abnormalities or cortical defect of the metacarpal or proximal phalanx  Left index finger: On the ulnar side there is a small hyperechoic structure at the PIP joint.  No hypoechoic fluid surrounding the area Impression: Small calcification.    Assessment & Plan:   Problem List  Items Addressed This Visit       Other   Bilateral hand pain - Primary    He does not appear to have any joint effusion.  Concerned about soft tissue swelling.  I have sent to his pharmacy an antibiotic to cover for infection, Bactrim for 5 days.  And ibuprofen for some pain.  We will see him back in the office if he has had no improvement over the next 2 to 3 weeks.  If his symptoms worsen over the next week or 2 follow-up sooner.  Should he have 100 percent resolution of his symptoms he does not need to follow-up.      Relevant Orders   [place] LIMITED JOINT SPACE STRUCTURES UP BILAT    Return in about 3 weeks (around 08/05/2022).    Claudie Leach, DO

## 2022-07-15 NOTE — Patient Instructions (Signed)
Your ultrasound today did not show any involvement of the joint.  I have sent to your pharmacy an antibiotic to help cover for any infection.  I also sent some ibuprofen to your pharmacy to help with some of the swelling and pain.  Give this about 2 weeks and if you have not seen much of a difference follow up back in our office.

## 2022-07-15 NOTE — Assessment & Plan Note (Signed)
He does not appear to have any joint effusion.  Concerned about soft tissue swelling.  I have sent to his pharmacy an antibiotic to cover for infection, Bactrim for 5 days.  And ibuprofen for some pain.  We will see him back in the office if he has had no improvement over the next 2 to 3 weeks.  If his symptoms worsen over the next week or 2 follow-up sooner.  Should he have 100 percent resolution of his symptoms he does not need to follow-up.

## 2022-07-16 NOTE — Progress Notes (Signed)
[facility]: Attending Note: I have examined the patient, reviewed the chart, discussed the assessment and plan with the Sports Medicine Fellow. I agree with assessment and treatment plan as detailed in the Fellow's note. Hand pain and Soft tissue swelling. Likely infectious in origin.  US reveals the joint to be un-involved. Will treat for soft tissue infection. This is not tenosynovitis and not septic joint. Rheumatological disorder was considered, but if that is the culprit, it is affecting soft tissues, not bone or joint.

## 2022-07-28 ENCOUNTER — Ambulatory Visit (INDEPENDENT_AMBULATORY_CARE_PROVIDER_SITE_OTHER): Payer: 59 | Admitting: Sports Medicine

## 2022-07-28 VITALS — BP 132/84 | Ht 73.0 in | Wt 170.0 lb

## 2022-07-28 DIAGNOSIS — M25579 Pain in unspecified ankle and joints of unspecified foot: Secondary | ICD-10-CM | POA: Diagnosis not present

## 2022-07-28 DIAGNOSIS — M79642 Pain in left hand: Secondary | ICD-10-CM | POA: Diagnosis not present

## 2022-07-28 DIAGNOSIS — R229 Localized swelling, mass and lump, unspecified: Secondary | ICD-10-CM | POA: Diagnosis not present

## 2022-07-28 DIAGNOSIS — M79641 Pain in right hand: Secondary | ICD-10-CM | POA: Diagnosis not present

## 2022-07-28 NOTE — Progress Notes (Signed)
   Subjective:    Patient ID: Anthony Ramos, male    DOB: Jun 21, 1970, 52 y.o.   MRN: [id number]  HPI  Patient presents today for follow-up on bilateral hand pain.  He was last seen in the office 2 weeks ago.  At that time he was placed on a short course of Bactrim to cover any sort of possible bacterial infection.  His symptoms have not changed.  In regards to the right hand he has swelling diffusely around the third MCP joint which is not painful but does result in significant stiffness here.  He endorses pain in the left index finger with intermittent swelling.  He is also began to notice collapse of his transverse arches bilaterally.  His right transverse arch has been collapsed chronically but his left foot most recently began to collapse to the point where he has significant splaying of his forefoot.  This resulted in bilateral foot pain.  Although he has never had any workup for rheumatological disease, he does have a history of both eczema and psoriasis.  He also has a family history of rheumatoid arthritis, specifically his grandfather and a cousin.  X-rays of his right hand done recently showed some osteoarthritic change at the second MCP joint but otherwise unremarkable.  Review of Systems As above    Objective:   Physical Exam  Right hand: Diffuse swelling around the third MCP joint dorsally.  Patient is able to make a complete fist.  No erythema.  No tenderness to palpation.  Left hand: There is tenderness to palpation around the second MCP joint.  Mild swelling here.  No erythema.  Examination of both feet in the standing position shows significant transverse arch collapse bilaterally with splaying of the first and second toe as well as the second and third toe.  Prominent tailor's bunions present.  Skin: Multiple psoriatic plaques        Assessment & Plan:   Bilateral hand swelling of unknown etiology Psoriasis Eczema Family history of rheumatoid arthritis Severe  bilateral transverse arch collapse  I'm going to start with a basic rheumatological panel including a CBC, CMP, sed rate, C-reactive protein, ANA, rheumatoid factor, and anti-CCP antibody.  I will MyChart message Anthony Ramos with those results when available.  He very well may need a rheumatological evaluation even if the studies are normal. For his feet, we will try a green sports insole with bilateral metatarsal cookies.  This will help provide some transverse arch support and hopefully help with his foot pain.  This note was dictated using Dragon naturally speaking software and may contain errors in syntax, spelling, or content which have not been identified prior to signing this note.

## 2022-07-29 ENCOUNTER — Other Ambulatory Visit: Payer: Self-pay

## 2022-07-29 DIAGNOSIS — M25579 Pain in unspecified ankle and joints of unspecified foot: Secondary | ICD-10-CM

## 2022-07-29 DIAGNOSIS — R229 Localized swelling, mass and lump, unspecified: Secondary | ICD-10-CM

## 2022-07-29 DIAGNOSIS — M79641 Pain in right hand: Secondary | ICD-10-CM

## 2022-07-30 LAB — COMPREHENSIVE METABOLIC PANEL
ALT: 18 IU/L (ref 0–44)
AST: 22 IU/L (ref 0–40)
Albumin: 4.3 g/dL (ref 3.8–4.9)
Alkaline Phosphatase: 106 IU/L (ref 44–121)
BUN/Creatinine Ratio: 9 (ref 9–20)
BUN: 8 mg/dL (ref 6–24)
Bilirubin Total: 0.3 mg/dL (ref 0.0–1.2)
CO2: 24 mmol/L (ref 20–29)
Calcium: 9.2 mg/dL (ref 8.7–10.2)
Chloride: 103 mmol/L (ref 96–106)
Creatinine, Ser: 0.91 mg/dL (ref 0.76–1.27)
Globulin, Total: 2 g/dL (ref 1.5–4.5)
Glucose: 109 mg/dL — ABNORMAL HIGH (ref 70–99)
Potassium: 4.3 mmol/L (ref 3.5–5.2)
Sodium: 141 mmol/L (ref 134–144)
Total Protein: 6.3 g/dL (ref 6.0–8.5)
eGFR: 101 mL/min/{1.73_m2} (ref >59)

## 2022-07-30 LAB — C-REACTIVE PROTEIN: CRP: 1 mg/L (ref 0–10)

## 2022-07-30 LAB — ANA: Anti Nuclear Antibody (ANA): NEGATIVE

## 2022-07-30 LAB — CBC
Hematocrit: 45.4 % (ref 37.5–51.0)
Hemoglobin: 14.9 g/dL (ref 13.0–17.7)
MCH: 33.1 pg — ABNORMAL HIGH (ref 26.6–33.0)
MCHC: 32.8 g/dL (ref 31.5–35.7)
MCV: 101 fL — ABNORMAL HIGH (ref 79–97)
Platelets: 260 10*3/uL (ref 150–450)
RBC: 4.5 x10E6/uL (ref 4.14–5.80)
RDW: 13.1 % (ref 11.6–15.4)
WBC: 6 10*3/uL (ref 3.4–10.8)

## 2022-07-30 LAB — CYCLIC CITRUL PEPTIDE ANTIBODY, IGG/IGA: Cyclic Citrullin Peptide Ab: 4 units (ref 0–19)

## 2022-07-30 LAB — RHEUMATOID FACTOR: Rheumatoid fact SerPl-aCnc: 10.3 IU/mL (ref <14.0)

## 2022-07-30 LAB — SEDIMENTATION RATE: Sed Rate: 2 mm/hr (ref 0–30)

## 2022-08-09 ENCOUNTER — Encounter: Payer: Self-pay | Admitting: Sports Medicine

## 2022-08-15 DIAGNOSIS — M549 Dorsalgia, unspecified: Secondary | ICD-10-CM | POA: Diagnosis not present

## 2022-08-15 DIAGNOSIS — L4050 Arthropathic psoriasis, unspecified: Secondary | ICD-10-CM | POA: Diagnosis not present

## 2022-08-15 DIAGNOSIS — L409 Psoriasis, unspecified: Secondary | ICD-10-CM | POA: Diagnosis not present

## 2022-08-15 DIAGNOSIS — M79641 Pain in right hand: Secondary | ICD-10-CM | POA: Diagnosis not present

## 2022-08-15 DIAGNOSIS — M1990 Unspecified osteoarthritis, unspecified site: Secondary | ICD-10-CM | POA: Diagnosis not present

## 2022-08-15 DIAGNOSIS — M79672 Pain in left foot: Secondary | ICD-10-CM | POA: Diagnosis not present

## 2022-08-15 DIAGNOSIS — M79671 Pain in right foot: Secondary | ICD-10-CM | POA: Diagnosis not present

## 2022-08-15 DIAGNOSIS — M79642 Pain in left hand: Secondary | ICD-10-CM | POA: Diagnosis not present

## 2022-08-15 DIAGNOSIS — Z79899 Other long term (current) drug therapy: Secondary | ICD-10-CM | POA: Diagnosis not present

## 2022-08-29 ENCOUNTER — Other Ambulatory Visit (HOSPITAL_COMMUNITY): Payer: Self-pay

## 2022-08-29 ENCOUNTER — Other Ambulatory Visit: Payer: Self-pay

## 2022-08-29 DIAGNOSIS — Z79899 Other long term (current) drug therapy: Secondary | ICD-10-CM | POA: Diagnosis not present

## 2022-08-29 DIAGNOSIS — L4050 Arthropathic psoriasis, unspecified: Secondary | ICD-10-CM | POA: Diagnosis not present

## 2022-08-29 DIAGNOSIS — L409 Psoriasis, unspecified: Secondary | ICD-10-CM | POA: Diagnosis not present

## 2022-08-29 DIAGNOSIS — M2550 Pain in unspecified joint: Secondary | ICD-10-CM | POA: Diagnosis not present

## 2022-08-29 MED ORDER — OTEZLA 30 MG PO TABS
30.0000 mg | ORAL_TABLET | Freq: Two times a day (BID) | ORAL | 0 refills | Status: DC
  Filled 2022-08-29 (×2): qty 60, 30d supply, fill #0

## 2022-10-22 ENCOUNTER — Encounter (HOSPITAL_COMMUNITY): Payer: Self-pay

## 2022-11-06 ENCOUNTER — Other Ambulatory Visit (HOSPITAL_COMMUNITY): Payer: Self-pay

## 2022-11-22 ENCOUNTER — Other Ambulatory Visit (HOSPITAL_COMMUNITY): Payer: Self-pay

## 2023-03-19 IMAGING — DX DG RIBS W/ CHEST 3+V*L*
5 series · 5 of 5 positions shown · non-contrast
Comparison: None.

CLINICAL DATA: Injury

EXAM:
LEFT RIBS AND CHEST - 3+ VIEW

[chest pa]
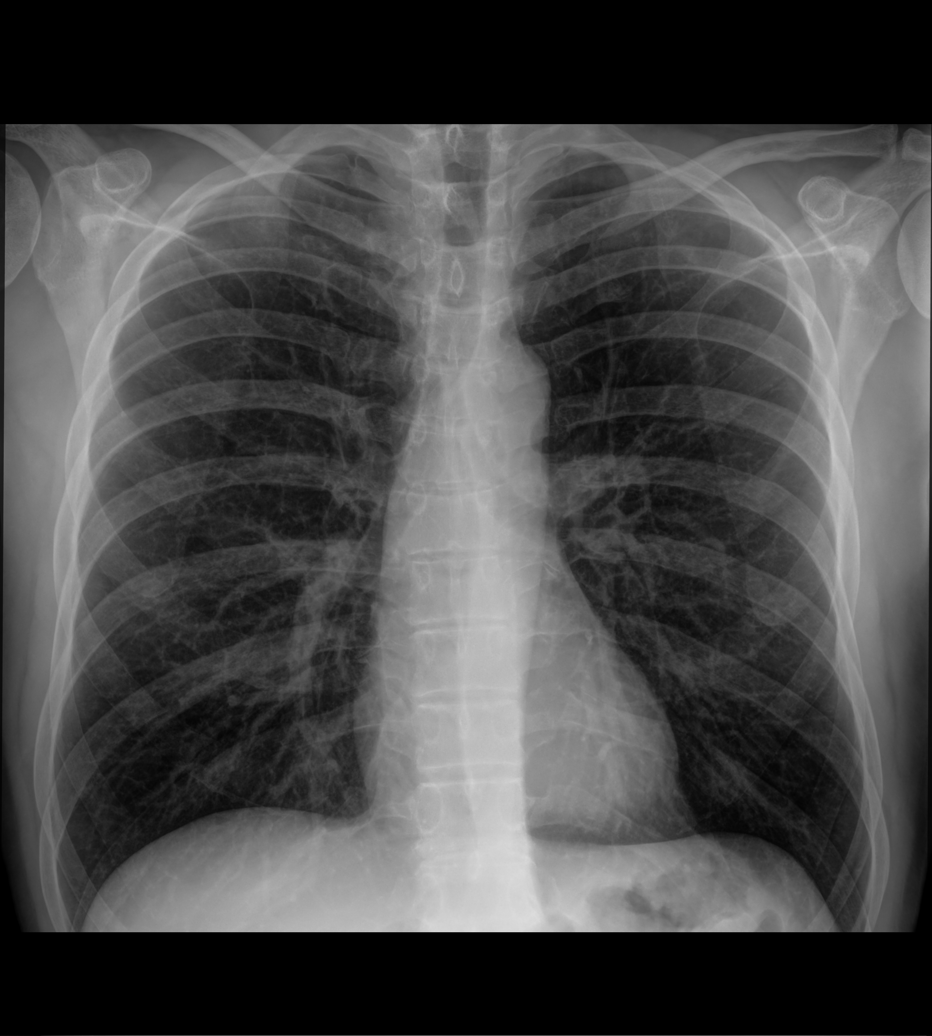

[hemithorax (ribs) pa (1 of 2)]
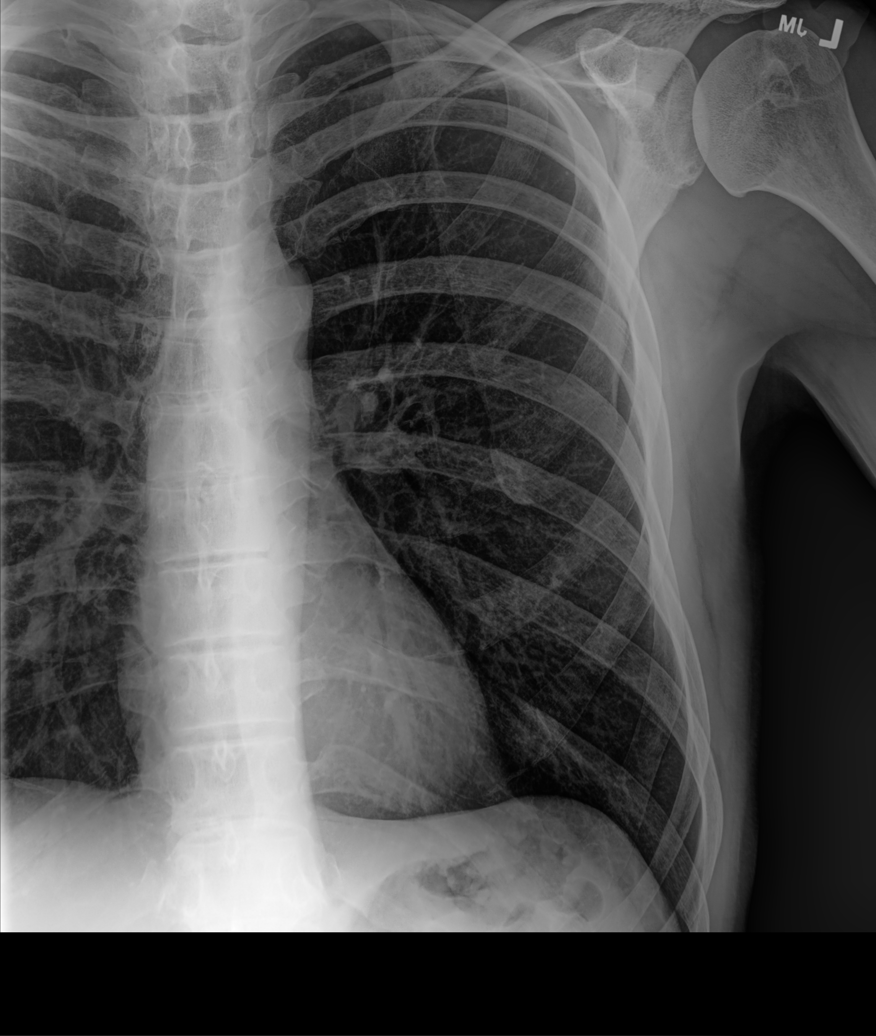

[hemithorax (ribs) pa (2 of 2)]
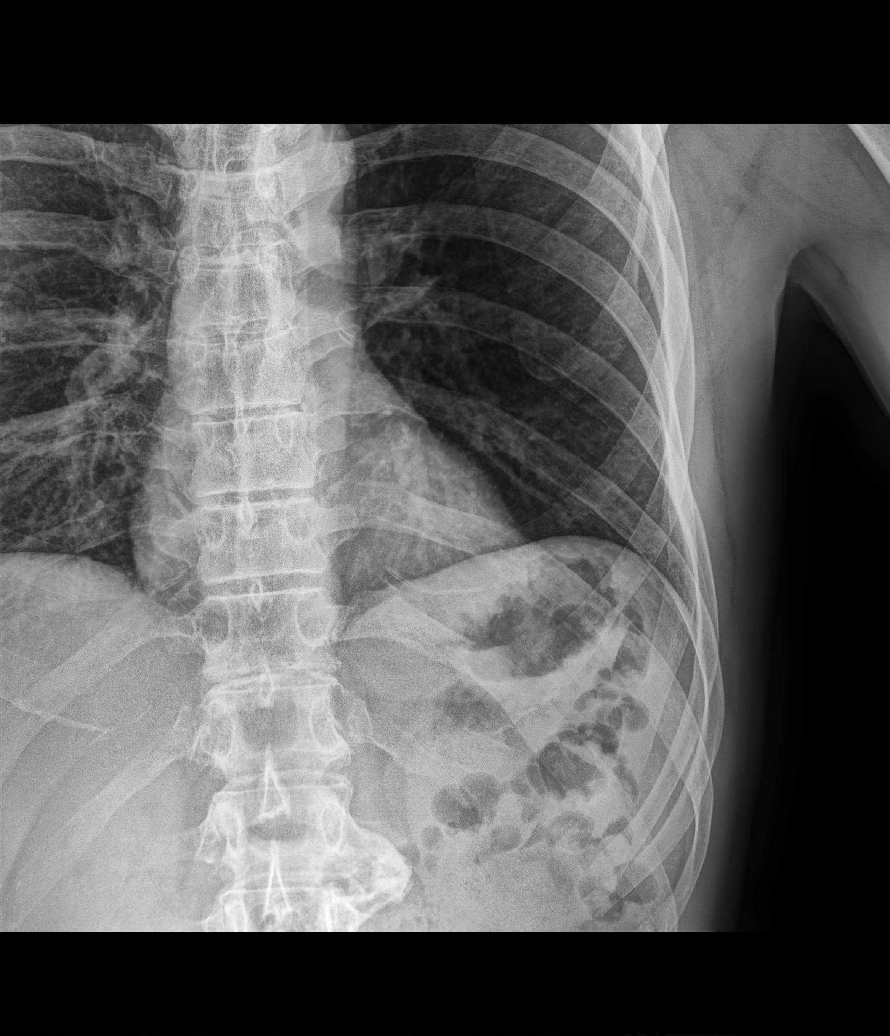

[hemithorax (ribs) pa obl (1 of 2)]
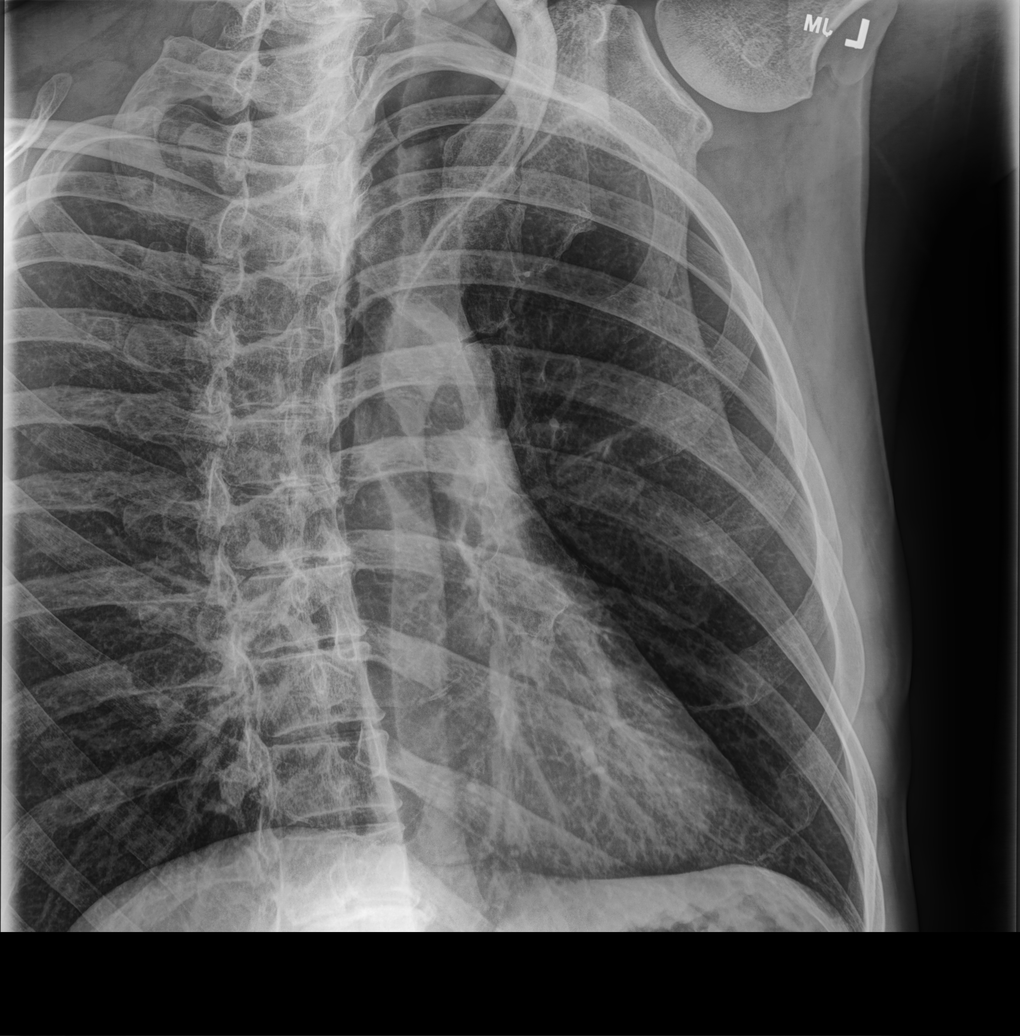

[hemithorax (ribs) pa obl (2 of 2)]
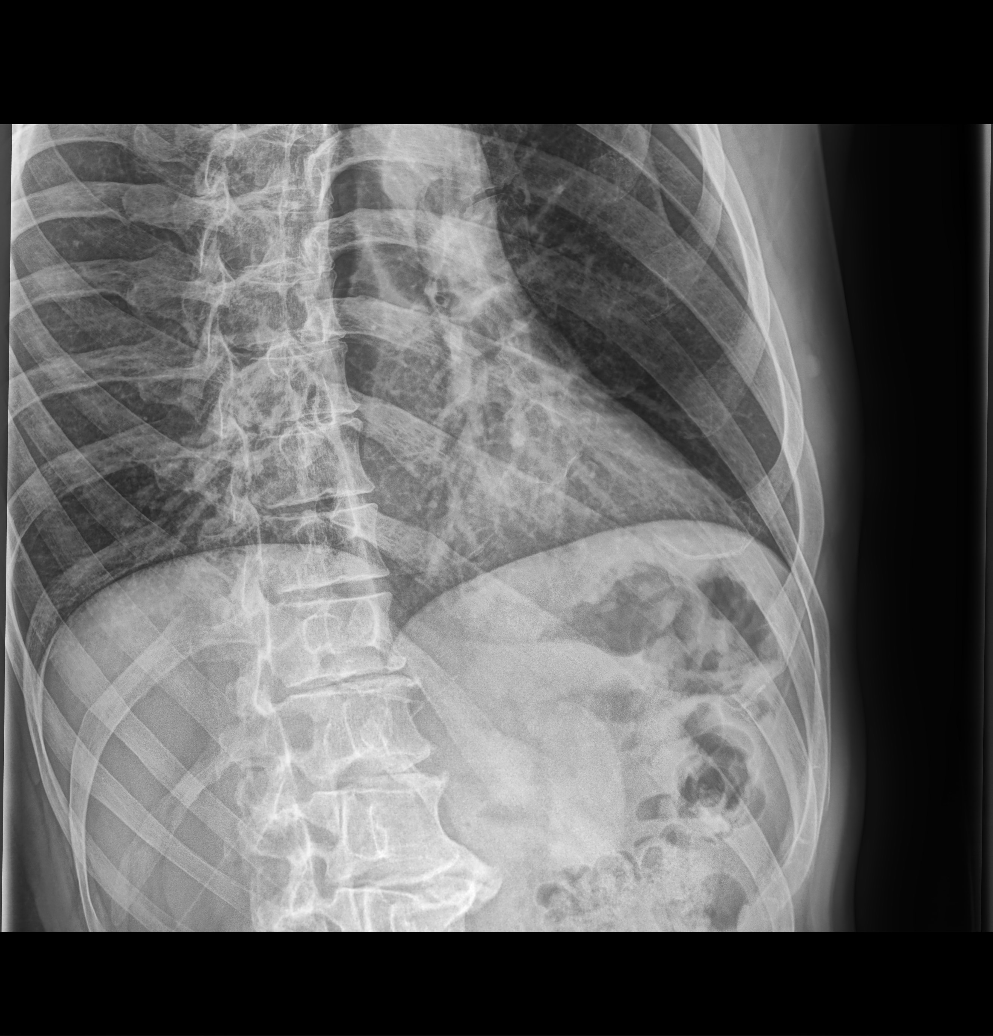

[5 of 5 positions shown; findings below may reference images not displayed]

FINDINGS: No fracture or other bone lesions are seen involving the ribs. There
is no evidence of pneumothorax or pleural effusion. Both lungs are
clear. Heart size and mediastinal contours are within normal limits.
IMPRESSION: No displaced fracture or other radiographic abnormality of the left
ribs.

## 2023-03-29 ENCOUNTER — Other Ambulatory Visit (HOSPITAL_COMMUNITY): Payer: Self-pay

## 2023-03-29 ENCOUNTER — Other Ambulatory Visit: Payer: Self-pay | Admitting: Family Medicine

## 2023-03-29 DIAGNOSIS — J45909 Unspecified asthma, uncomplicated: Secondary | ICD-10-CM

## 2023-03-29 MED ORDER — ALBUTEROL SULFATE HFA 108 (90 BASE) MCG/ACT IN AERS
2.0000 | INHALATION_SPRAY | Freq: Four times a day (QID) | RESPIRATORY_TRACT | 1 refills | Status: DC | PRN
  Filled 2023-03-29: qty 6.7, 25d supply, fill #0

## 2023-04-04 ENCOUNTER — Other Ambulatory Visit (HOSPITAL_COMMUNITY): Payer: Self-pay

## 2023-06-30 ENCOUNTER — Encounter: Payer: 59 | Admitting: Family Medicine

## 2023-07-31 ENCOUNTER — Other Ambulatory Visit (HOSPITAL_COMMUNITY): Payer: Self-pay

## 2023-07-31 ENCOUNTER — Telehealth: Payer: Self-pay | Admitting: Family Medicine

## 2023-07-31 DIAGNOSIS — J45909 Unspecified asthma, uncomplicated: Secondary | ICD-10-CM

## 2023-07-31 MED ORDER — ALBUTEROL SULFATE HFA 108 (90 BASE) MCG/ACT IN AERS
2.0000 | INHALATION_SPRAY | Freq: Four times a day (QID) | RESPIRATORY_TRACT | 0 refills | Status: DC | PRN
  Filled 2023-07-31: qty 6.7, 25d supply, fill #0

## 2023-07-31 NOTE — Telephone Encounter (Signed)
 Copied from CRM [id number]. Topic: Clinical - Medication Refill >> Jul 31, 2023  2:13 PM Corin V wrote: Medication: albuterol  (VENTOLIN  HFA) 108 (90 Base) MCG/ACT inhaler  Has the patient contacted their pharmacy? Yes (Agent: If no, request that the patient contact the pharmacy for the refill. If patient does not wish to contact the pharmacy document the reason why and proceed with request.) (Agent: If yes, when and what did the pharmacy advise?)  This is the patient's preferred pharmacy:  [facility] [address], [address] [place] [address] Phone: [phone] Fax: [phone]  Is this the correct pharmacy for this prescription? Yes If no, delete pharmacy and type the correct one.   Has the prescription been filled recently? No  Is the patient out of the medication? No- he is very low, unsure if he will have enough to get through next 3 days.  Has the patient been seen for an appointment in the last year OR does the patient have an upcoming appointment? Yes  Can we respond through MyChart? No  Agent: Please be advised that Rx refills may take up to 3 business days. We ask that you follow-up with your pharmacy.

## 2023-08-02 ENCOUNTER — Other Ambulatory Visit (HOSPITAL_COMMUNITY): Payer: Self-pay

## 2023-12-13 DIAGNOSIS — M2550 Pain in unspecified joint: Secondary | ICD-10-CM | POA: Diagnosis not present

## 2023-12-13 DIAGNOSIS — L409 Psoriasis, unspecified: Secondary | ICD-10-CM | POA: Diagnosis not present

## 2023-12-13 DIAGNOSIS — Z79899 Other long term (current) drug therapy: Secondary | ICD-10-CM | POA: Diagnosis not present

## 2023-12-26 ENCOUNTER — Other Ambulatory Visit (HOSPITAL_COMMUNITY): Payer: Self-pay

## 2023-12-26 ENCOUNTER — Other Ambulatory Visit: Payer: Self-pay | Admitting: Family Medicine

## 2023-12-26 DIAGNOSIS — J45909 Unspecified asthma, uncomplicated: Secondary | ICD-10-CM

## 2023-12-26 MED ORDER — ALBUTEROL SULFATE HFA 108 (90 BASE) MCG/ACT IN AERS
2.0000 | INHALATION_SPRAY | Freq: Four times a day (QID) | RESPIRATORY_TRACT | 0 refills | Status: DC | PRN
  Filled 2023-12-26: qty 6.7, 25d supply, fill #0

## 2023-12-26 NOTE — Telephone Encounter (Signed)
 Copied from CRM #[id number]. Topic: Clinical - Medication Refill >> Dec 26, 2023  3:29 PM Sophia H wrote: Medication: albuterol  (VENTOLIN  HFA) 108 (90 Base) MCG/ACT inhaler   Has the patient contacted their pharmacy? Yes, needed appt.   This is the patient's preferred pharmacy:  [place] [facility] [address], [address] [place] [address] Phone: [phone] Fax: [phone]  Is this the correct pharmacy for this prescription? Yes If no, delete pharmacy and type the correct one.   Has the prescription been filled recently? Yes  Is the patient out of the medication? Yes  Has the patient been seen for an appointment in the last year OR does the patient have an upcoming appointment? Yes, appt 12/1 - needing enough to get to that appt.   Can we respond through MyChart? Yes  Agent: Please be advised that Rx refills may take up to 3 business days. We ask that you follow-up with your pharmacy.

## 2023-12-27 ENCOUNTER — Ambulatory Visit: Admitting: Podiatry

## 2023-12-27 ENCOUNTER — Ambulatory Visit

## 2023-12-27 ENCOUNTER — Encounter: Payer: Self-pay | Admitting: Podiatry

## 2023-12-27 VITALS — Ht 73.0 in | Wt 170.0 lb

## 2023-12-27 DIAGNOSIS — M05772 Rheumatoid arthritis with rheumatoid factor of left ankle and foot without organ or systems involvement: Secondary | ICD-10-CM | POA: Diagnosis not present

## 2023-12-27 DIAGNOSIS — M05771 Rheumatoid arthritis with rheumatoid factor of right ankle and foot without organ or systems involvement: Secondary | ICD-10-CM

## 2023-12-27 DIAGNOSIS — M21622 Bunionette of left foot: Secondary | ICD-10-CM

## 2023-12-27 DIAGNOSIS — M21621 Bunionette of right foot: Secondary | ICD-10-CM

## 2024-01-01 ENCOUNTER — Other Ambulatory Visit (HOSPITAL_COMMUNITY): Payer: Self-pay

## 2024-01-01 ENCOUNTER — Ambulatory Visit: Admitting: Family Medicine

## 2024-01-01 VITALS — BP 131/83 | HR 99 | Ht 73.0 in | Wt 167.0 lb

## 2024-01-01 DIAGNOSIS — J45909 Unspecified asthma, uncomplicated: Secondary | ICD-10-CM | POA: Diagnosis not present

## 2024-01-01 DIAGNOSIS — Z125 Encounter for screening for malignant neoplasm of prostate: Secondary | ICD-10-CM | POA: Diagnosis not present

## 2024-01-01 DIAGNOSIS — Z131 Encounter for screening for diabetes mellitus: Secondary | ICD-10-CM

## 2024-01-01 DIAGNOSIS — Z1159 Encounter for screening for other viral diseases: Secondary | ICD-10-CM | POA: Diagnosis not present

## 2024-01-01 DIAGNOSIS — Z13220 Encounter for screening for lipoid disorders: Secondary | ICD-10-CM | POA: Diagnosis not present

## 2024-01-01 DIAGNOSIS — Z114 Encounter for screening for human immunodeficiency virus [HIV]: Secondary | ICD-10-CM

## 2024-01-01 DIAGNOSIS — L4050 Arthropathic psoriasis, unspecified: Secondary | ICD-10-CM | POA: Diagnosis not present

## 2024-01-01 MED ORDER — ALBUTEROL SULFATE HFA 108 (90 BASE) MCG/ACT IN AERS
2.0000 | INHALATION_SPRAY | Freq: Four times a day (QID) | RESPIRATORY_TRACT | 5 refills | Status: AC | PRN
  Filled 2024-01-01: qty 18, 68d supply, fill #0

## 2024-01-01 MED ORDER — BUDESONIDE-FORMOTEROL FUMARATE 160-4.5 MCG/ACT IN AERO
2.0000 | INHALATION_SPRAY | Freq: Two times a day (BID) | RESPIRATORY_TRACT | 5 refills | Status: AC
  Filled 2024-01-01: qty 10.2, 30d supply, fill #0

## 2024-01-01 MED ORDER — CELECOXIB 100 MG PO CAPS
100.0000 mg | ORAL_CAPSULE | Freq: Two times a day (BID) | ORAL | 5 refills | Status: AC
  Filled 2024-01-01: qty 60, 30d supply, fill #0

## 2024-01-01 NOTE — Assessment & Plan Note (Signed)
 Reports recent exacerbation following a COVID-19 infection in October, with increased nocturnal symptoms requiring frequent albuterol  use. Symptoms are now improving but still present. - Prescribed Symbicort (budesonide/formoterol) 2 puffs twice daily as a maintenance inhaler. - Refilled Albuterol  inhaler for as-needed use. - Counseled on use of both inhalers.

## 2024-01-01 NOTE — Assessment & Plan Note (Signed)
 Condition has worsened significantly, with progressive deformity and pain in the hands and feet, now spreading to knees and shoulders. 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 Has been evaluated by rheumatology (Dr. Govinda Aryal) and podiatry. Has been hesitant to start biologics like Humira due to concerns about side effects (immunosuppression, potential worsening of psychiatric conditions) and cost. Podiatrist was uncertain if surgery for foot deformity would help with the arthritic pain. Discussed the progressive nature of the disease and the importance of disease-modifying agents to prevent further joint damage. - Prescribed Celebrex 200mg  twice daily for pain and inflammation as a safer long-term NSAID option. - Counseled on the process of applying for permanent disability through the [employer], starting with scheduling an appointment. - Strongly recommended re-engaging with rheumatologist to discuss starting a disease-modifying agent (e.g., DMARD or biologic like Humira). Discussed that newer biologics may have fewer side effects and that the immunosuppression risk is manageable and not permanent. - Will request records from Dr. Govinda Aryal.

## 2024-01-01 NOTE — Patient Instructions (Signed)
 It was nice to see you today,  We addressed the following topics today: - I have sent prescriptions for Celebrex, Symbicort, and albuterol  to [facility] pharmacy. The Celebrex should last until your follow-up appointment. - Please schedule an appointment with the Social Security Administration office to begin the disability application process. You can find information and contact details on their website. - I strongly encourage you to follow up with your rheumatologist to discuss starting a medication like Humira to slow the progression of your arthritis. The benefits often outweigh the risks, which are manageable. - If the copay for the Symbicort inhaler is too high, it is acceptable to continue using only the albuterol  inhaler for now if your symptoms are infrequent. - We will schedule you for a physical exam and lab work in about two months. Please schedule this at the front desk before you leave.  Have a great day,  Rolan Slain, MD

## 2024-01-01 NOTE — Progress Notes (Unsigned)
 Established Patient Office Visit  Subjective   Patient ID: Markevious Ehmke, male    DOB: 1970-05-06  Age: 53 y.o. MRN: [id number]  Chief Complaint  Patient presents with   Arthritis    HPI  Subjective - Follow-up for psoriatic arthritis and request for asthma inhaler refill. - Psoriatic arthritis has worsened significantly since last visit (~18 months ago). Reports progressive deformity and severe pain in feet, making it difficult to walk. 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 The pain is now also affecting knees and shoulders. The hand pain, initially thought to be a fracture, is part of the arthritis. 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 - Recent COVID-19 infection in October led to worsening asthma symptoms, particularly at night, requiring albuterol  inhaler use up to twice nightly. Symptoms are now improving.  Medications Current medications include albuterol  inhaler as needed for asthma. Reports infrequent use (approx. 1 inhaler per year) until recent illness. Takes ibuprofen  occasionally for pain but is aware of risks with regular use.  PMH, PSH, FH, Social Hx PMHx: Psoriatic arthritis, diagnosed after referral to sports medicine and then rheumatology. History of PTSD, depression, and anxiety. History of childhood trauma. PSH: None mentioned. FH: None mentioned. 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 Work is physically demanding, requiring being on feet for up to 12 hours a day. Is considering applying for permanent disability through the [employer] Administration due to inability to work. Concerned about finances and the impact on his wife.  ROS Constitutional: Denies fever, chills. Reports significant pain. Respiratory: Reports recent increase in nocturnal wheezing and coughing post-COVID, now improving. Denies current wheezing. MSK: Reports severe, progressive  joint pain and deformity in feet, hands, knees, and shoulders. Derm: History of psoriasis. Psychiatric: Reports increased anxiety related to functional decline and work status. History of PTSD, depression.    The ASCVD Risk score (Arnett DK, et al., 2019) failed to calculate for the following reasons:   Cannot find a previous HDL lab   Cannot find a previous total cholesterol lab  Health Maintenance Due  Topic Date Due   HIV Screening  Never done   Hepatitis C Screening  Never done   DTaP/Tdap/Td (1 - Tdap) Never done   Pneumococcal Vaccine: 50+ Years (1 of 2 - PCV) Never done   Hepatitis B Vaccines 19-59 Average Risk (1 of 3 - 19+ 3-dose series) Never done   Zoster Vaccines- Shingrix (1 of 2) Never done   Colonoscopy  Never done   Influenza Vaccine  Never done   COVID-19 Vaccine (4 - 2025-26 season) 10/02/2023      Objective:     BP 131/83   Pulse 99   Ht 6' 1 (1.854 m)   Wt 167 lb (75.8 kg)   SpO2 100%   BMI 22.03 kg/m  {Vitals History (Optional):23777}  Physical Exam Gen: alert, oriented Pulm: no respiratory distress Psych: pleasant affect   No results found for any visits on 01/01/24.      Assessment & Plan:   Psoriatic arthritis (HCC) Assessment & Plan: Condition has worsened significantly, with progressive deformity and pain in the hands and feet, now spreading to knees and shoulders. 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 Has been evaluated by rheumatology (Dr. Govinda Aryal) and podiatry. Has been hesitant to start biologics like Humira due to concerns about side effects (immunosuppression, potential worsening  of psychiatric conditions) and cost. Podiatrist was uncertain if surgery for foot deformity would help with the arthritic pain. Discussed the progressive nature of the disease and the importance of disease-modifying agents to prevent further joint damage. - Prescribed Celebrex  200mg  twice daily for pain and inflammation as a safer long-term NSAID option. - Counseled on the process of applying for permanent disability through the [employer], starting with scheduling an appointment. - Strongly recommended re-engaging with rheumatologist to discuss starting a disease-modifying agent (e.g., DMARD or biologic like Humira). Discussed that newer biologics may have fewer side effects and that the immunosuppression risk is manageable and not permanent. - Will request records from Dr. Cindy Setter.   Asthma, unspecified asthma severity, unspecified whether complicated, unspecified whether persistent -     Albuterol  Sulfate HFA; Inhale 2 puffs into the lungs every 6 (six) hours as needed for wheezing or shortness of breath.  Dispense: 18 g; Refill: 5  Other orders -     Budesonide-Formoterol Fumarate; Inhale 2 puffs into the lungs 2 (two) times daily.  Dispense: 1 each; Refill: 5 -     Celecoxib; Take 1 capsule (100 mg total) by mouth 2 (two) times daily.  Dispense: 60 capsule; Refill: 5     Return in about 2 months (around 03/03/2024) for physical.    Toribio MARLA Slain, MD

## 2024-01-07 NOTE — Progress Notes (Signed)
   Chief Complaint  Patient presents with   Foot Pain    Pt is here due to bilateral foot pain, states he has arthritis in his joints, states he cannot be on his feet long.    HPI: 53 y.o. male presenting today as a reestablish new patient for evaluation of tailor's bunion deformity to the bilateral feet.  Past Medical History:  Diagnosis Date   Allergy    Asthma      Physical Exam: General: The patient is alert and oriented x3 in no acute distress.  Dermatology: Skin is warm, dry and supple bilateral lower extremities. Negative for open lesions or macerations.  Vascular: Palpable pedal pulses bilaterally. No edema or erythema noted. Capillary refill within normal limits.  Neurological: Grossly intact via light touch  Musculoskeletal Exam: Mostly unchanged since last visit on 05/13/2020.  Range of motion within normal limits to all pedal and ankle joints bilateral. Muscle strength 5/5 in all groups bilateral. Clinical evidence of tailor's bunion deformity noted to the bilateral feet right greater than the left  Radiographic Exam B/L feet 12/27/2023:  Normal osseous mineralization. Joint spaces preserved. No fracture/dislocation/boney destruction.  Increased intermetatarsal space between the fourth and fifth metatarsals with lateral deviation of the fifth metatarsal head noted bilateral right greater than the left.  The right foot almost demonstrates a subluxation of the MTP joint  Assessment: 1.  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 X-Rays reviewed.  -Today again we discussed conservative and surgical treatment options.  Currently the patient is not in a position to have surgery.  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  Recommend compression socks daily -Recommend good shoe gear that does not irritate or aggravate the tailor's bunion deformities -Return to clinic as needed, or when he  is ready for surgical intervention      Thresa EMERSON Sar, DPM [facility]  Dr. Thresa EMERSON Sar, DPM    [address] [place], [place] [address]                Office [phone]  Fax [phone]

## 2024-02-08 ENCOUNTER — Other Ambulatory Visit

## 2024-02-08 DIAGNOSIS — Z114 Encounter for screening for human immunodeficiency virus [HIV]: Secondary | ICD-10-CM

## 2024-02-08 DIAGNOSIS — L4050 Arthropathic psoriasis, unspecified: Secondary | ICD-10-CM

## 2024-02-08 DIAGNOSIS — J45909 Unspecified asthma, uncomplicated: Secondary | ICD-10-CM

## 2024-02-08 DIAGNOSIS — Z131 Encounter for screening for diabetes mellitus: Secondary | ICD-10-CM

## 2024-02-08 DIAGNOSIS — Z1159 Encounter for screening for other viral diseases: Secondary | ICD-10-CM

## 2024-02-08 DIAGNOSIS — Z125 Encounter for screening for malignant neoplasm of prostate: Secondary | ICD-10-CM

## 2024-02-08 DIAGNOSIS — Z13220 Encounter for screening for lipoid disorders: Secondary | ICD-10-CM

## 2024-02-09 ENCOUNTER — Ambulatory Visit: Payer: Self-pay | Admitting: Family Medicine

## 2024-02-09 LAB — COMPREHENSIVE METABOLIC PANEL WITH GFR
ALT: 25 IU/L (ref 0–44)
AST: 27 IU/L (ref 0–40)
Albumin: 4.5 g/dL (ref 3.8–4.9)
Alkaline Phosphatase: 112 IU/L (ref 47–123)
BUN/Creatinine Ratio: 16 (ref 9–20)
BUN: 12 mg/dL (ref 6–24)
Bilirubin Total: 0.4 mg/dL (ref 0.0–1.2)
CO2: 21 mmol/L (ref 20–29)
Calcium: 10 mg/dL (ref 8.7–10.2)
Chloride: 99 mmol/L (ref 96–106)
Creatinine, Ser: 0.77 mg/dL (ref 0.76–1.27)
Globulin, Total: 2.1 g/dL (ref 1.5–4.5)
Glucose: 81 mg/dL (ref 70–99)
Potassium: 4.1 mmol/L (ref 3.5–5.2)
Sodium: 140 mmol/L (ref 134–144)
Total Protein: 6.6 g/dL (ref 6.0–8.5)
eGFR: 106 mL/min/1.73

## 2024-02-09 LAB — CBC WITH DIFFERENTIAL/PLATELET
Basophils Absolute: 0.1 x10E3/uL (ref 0.0–0.2)
Basos: 1 %
EOS (ABSOLUTE): 0.4 x10E3/uL (ref 0.0–0.4)
Eos: 6 %
Hematocrit: 45.8 % (ref 37.5–51.0)
Hemoglobin: 15.4 g/dL (ref 13.0–17.7)
Immature Grans (Abs): 0 x10E3/uL (ref 0.0–0.1)
Immature Granulocytes: 0 %
Lymphocytes Absolute: 2.2 x10E3/uL (ref 0.7–3.1)
Lymphs: 29 %
MCH: 33.4 pg — ABNORMAL HIGH (ref 26.6–33.0)
MCHC: 33.6 g/dL (ref 31.5–35.7)
MCV: 99 fL — ABNORMAL HIGH (ref 79–97)
Monocytes Absolute: 0.6 x10E3/uL (ref 0.1–0.9)
Monocytes: 7 %
Neutrophils Absolute: 4.3 x10E3/uL (ref 1.4–7.0)
Neutrophils: 57 %
Platelets: 317 x10E3/uL (ref 150–450)
RBC: 4.61 x10E6/uL (ref 4.14–5.80)
RDW: 11.9 % (ref 11.6–15.4)
WBC: 7.7 x10E3/uL (ref 3.4–10.8)

## 2024-02-09 LAB — HEPATITIS B SURFACE ANTIBODY,QUALITATIVE: Hep B Surface Ab, Qual: NONREACTIVE

## 2024-02-09 LAB — LIPID PANEL
Chol/HDL Ratio: 2 ratio (ref 0.0–5.0)
Cholesterol, Total: 199 mg/dL (ref 100–199)
HDL: 98 mg/dL
LDL Chol Calc (NIH): 89 mg/dL (ref 0–99)
Triglycerides: 69 mg/dL (ref 0–149)
VLDL Cholesterol Cal: 12 mg/dL (ref 5–40)

## 2024-02-09 LAB — HEPATITIS B SURFACE ANTIGEN: Hepatitis B Surface Ag: NEGATIVE

## 2024-02-09 LAB — PSA: Prostate Specific Ag, Serum: 1.6 ng/mL (ref 0.0–4.0)

## 2024-02-09 LAB — TSH: TSH: 1.8 u[IU]/mL (ref 0.450–4.500)

## 2024-02-09 LAB — HIV ANTIBODY (ROUTINE TESTING W REFLEX): HIV Screen 4th Generation wRfx: NONREACTIVE

## 2024-02-09 LAB — HEMOGLOBIN A1C
Est. average glucose Bld gHb Est-mCnc: 111 mg/dL
Hgb A1c MFr Bld: 5.5 % (ref 4.8–5.6)

## 2024-02-09 LAB — HEPATITIS C ANTIBODY: Hep C Virus Ab: NONREACTIVE

## 2024-02-09 LAB — HEPATITIS B CORE ANTIBODY, IGM: Hep B C IgM: NEGATIVE
